# Patient Record
Sex: Female | Born: 1975 | Race: White | Hispanic: No | Smoking: Former smoker
Health system: Southern US, Community
[De-identification: ages and names within clinical notes are randomized; demographics above are authoritative.]

## PROBLEM LIST (undated history)

## (undated) HISTORY — PX: HIP SURGERY: SHX245

## (undated) HISTORY — PX: SHOULDER SURGERY: SHX246

## (undated) HISTORY — PX: KNEE SURGERY: SHX244

---

## 2015-01-13 ENCOUNTER — Emergency Department (HOSPITAL_BASED_OUTPATIENT_CLINIC_OR_DEPARTMENT_OTHER): Payer: BLUE CROSS/BLUE SHIELD

## 2015-01-13 ENCOUNTER — Emergency Department (HOSPITAL_BASED_OUTPATIENT_CLINIC_OR_DEPARTMENT_OTHER)
Admission: EM | Admit: 2015-01-13 | Discharge: 2015-01-13 | Disposition: A | Discharge status: Home / Self Care | Payer: BLUE CROSS/BLUE SHIELD | Attending: Emergency Medicine | Admitting: Emergency Medicine

## 2015-01-13 ENCOUNTER — Encounter (HOSPITAL_BASED_OUTPATIENT_CLINIC_OR_DEPARTMENT_OTHER): Payer: Self-pay | Admitting: Emergency Medicine

## 2015-01-13 DIAGNOSIS — R1011 Right upper quadrant pain: Secondary | ICD-10-CM | POA: Insufficient documentation

## 2015-01-13 DIAGNOSIS — Z3202 Encounter for pregnancy test, result negative: Secondary | ICD-10-CM | POA: Diagnosis not present

## 2015-01-13 DIAGNOSIS — Z87891 Personal history of nicotine dependence: Secondary | ICD-10-CM | POA: Insufficient documentation

## 2015-01-13 DIAGNOSIS — R1033 Periumbilical pain: Secondary | ICD-10-CM | POA: Diagnosis present

## 2015-01-13 DIAGNOSIS — R109 Unspecified abdominal pain: Secondary | ICD-10-CM

## 2015-01-13 LAB — CBC WITH DIFFERENTIAL/PLATELET
Basophils Absolute: 0.1 10*3/uL (ref 0.0–0.1)
Basophils Relative: 1 % (ref 0–1)
Eosinophils Absolute: 0.8 10*3/uL — ABNORMAL HIGH (ref 0.0–0.7)
Eosinophils Relative: 11 % — ABNORMAL HIGH (ref 0–5)
HCT: 36.1 % (ref 36.0–46.0)
Hemoglobin: 12.3 g/dL (ref 12.0–15.0)
LYMPHS ABS: 2.4 10*3/uL (ref 0.7–4.0)
Lymphocytes Relative: 33 % (ref 12–46)
MCH: 31.2 pg (ref 26.0–34.0)
MCHC: 34.1 g/dL (ref 30.0–36.0)
MCV: 91.6 fL (ref 78.0–100.0)
MONOS PCT: 7 % (ref 3–12)
Monocytes Absolute: 0.5 10*3/uL (ref 0.1–1.0)
Neutro Abs: 3.6 10*3/uL (ref 1.7–7.7)
Neutrophils Relative %: 48 % (ref 43–77)
Platelets: 277 10*3/uL (ref 150–400)
RBC: 3.94 MIL/uL (ref 3.87–5.11)
RDW: 12.4 % (ref 11.5–15.5)
WBC: 7.4 10*3/uL (ref 4.0–10.5)

## 2015-01-13 LAB — COMPREHENSIVE METABOLIC PANEL
ALT: 23 U/L (ref 14–54)
AST: 24 U/L (ref 15–41)
Albumin: 4.6 g/dL (ref 3.5–5.0)
Alkaline Phosphatase: 52 U/L (ref 38–126)
Anion gap: 8 (ref 5–15)
BUN: 12 mg/dL (ref 6–20)
CALCIUM: 8.9 mg/dL (ref 8.9–10.3)
CO2: 23 mmol/L (ref 22–32)
CREATININE: 0.91 mg/dL (ref 0.44–1.00)
Chloride: 106 mmol/L (ref 101–111)
GFR calc non Af Amer: >60 mL/min (ref >60)
GLUCOSE: 94 mg/dL (ref 65–99)
Potassium: 3.6 mmol/L (ref 3.5–5.1)
Sodium: 137 mmol/L (ref 135–145)
Total Bilirubin: 1 mg/dL (ref 0.3–1.2)
Total Protein: 7 g/dL (ref 6.5–8.1)

## 2015-01-13 LAB — URINALYSIS, ROUTINE W REFLEX MICROSCOPIC
Bilirubin Urine: NEGATIVE
Glucose, UA: NEGATIVE mg/dL
Hgb urine dipstick: NEGATIVE
Ketones, ur: NEGATIVE mg/dL
Leukocytes, UA: NEGATIVE
NITRITE: NEGATIVE
PROTEIN: NEGATIVE mg/dL
Specific Gravity, Urine: 1.009 (ref 1.005–1.030)
UROBILINOGEN UA: 0.2 mg/dL (ref 0.0–1.0)
pH: 5.5 (ref 5.0–8.0)

## 2015-01-13 LAB — PREGNANCY, URINE: Preg Test, Ur: NEGATIVE

## 2015-01-13 LAB — LIPASE, BLOOD: LIPASE: 20 U/L — AB (ref 22–51)

## 2015-01-13 MED ORDER — HYDROMORPHONE HCL 1 MG/ML IJ SOLN
1.0000 mg | Freq: Once | INTRAMUSCULAR | Status: AC
Start: 2015-01-13 — End: 2015-01-13
  Administered 2015-01-13: 1 mg via INTRAVENOUS
  Filled 2015-01-13: qty 1

## 2015-01-13 MED ORDER — HYDROMORPHONE HCL 1 MG/ML IJ SOLN
0.5000 mg | Freq: Once | INTRAMUSCULAR | Status: AC
Start: 2015-01-13 — End: 2015-01-13
  Administered 2015-01-13: 0.5 mg via INTRAVENOUS
  Filled 2015-01-13: qty 1

## 2015-01-13 NOTE — ED Provider Notes (Signed)
CSN: [id number]     Arrival date & time 01/13/15  1741 History  This chart was scribed for Margarita Grizzle, MD by Abel Presto, ED Scribe. This patient was seen in room MH01/MH01 and the patient's care was started at 6:06 PM.    Chief Complaint  Patient presents with  . Abdominal Pain      The history is provided by the patient. No language interpreter was used.   HPI Comments: Monette Twyman is a 39 y.o. female who presents to the Emergency Department complaining of 10/10 burning, stabbing periumbilical abdomianl pain with acute onset around 2 PM. Pt denies pain radiation. She took Aleve for relief. Pt's last normal BM was 6 AM. She states the last thing she ate was a protein shake. She denies chance of pregnancy. Pt is a former smoker and denies EtOH use. She denies changes in appetite, nausea, vomiting, diarrhea, fever, chills, urinary frequency, and dysuria.  History reviewed. No pertinent past medical history. Past Surgical History  Procedure Laterality Date  . Shoulder surgery    . Knee surgery     History reviewed. No pertinent family history. History  Substance Use Topics  . Smoking status: Former Games developer  . Smokeless tobacco: Not on file  . Alcohol Use: No   OB History    No data available     Review of Systems  Constitutional: Negative for fever, chills and appetite change.  Gastrointestinal: Positive for abdominal pain. Negative for nausea, vomiting and diarrhea.  Genitourinary: Negative for dysuria and frequency.  All other systems reviewed and are negative.     Allergies  Morphine and related  Home Medications   Prior to Admission medications   Not on File   BP 128/85 mmHg  Pulse 84  Temp(Src) 98.3 F (36.8 C) (Oral)  Resp 18  SpO2 100% Physical Exam  Constitutional: She is oriented to person, place, and time. She appears well-developed and well-nourished.  HENT:  Head: Normocephalic.  Eyes: Conjunctivae are normal.  Neck: Normal range of motion.  Neck supple.  Pulmonary/Chest: Effort normal.  Abdominal: Bowel sounds are decreased. There is tenderness (diffusely right side) in the right upper quadrant. There is guarding (voluntary). There is no rebound.  Musculoskeletal: Normal range of motion.  Neurological: She is alert and oriented to person, place, and time.  Skin: Skin is warm and dry.  Psychiatric: She has a normal mood and affect. Her behavior is normal.  Nursing note and vitals reviewed.   ED Course  Procedures (including critical care time) DIAGNOSTIC STUDIES: Oxygen Saturation is 100% on room air, normal by my interpretation.    COORDINATION OF CARE: 6:11 PM Discussed treatment plan with patient at beside, the patient agrees with the plan and has no further questions at this time.   Labs Review Labs Reviewed  CBC WITH DIFFERENTIAL/PLATELET - Abnormal; Notable for the following:    Eosinophils Relative 11 (*)    Eosinophils Absolute 0.8 (*)    All other components within normal limits  LIPASE, BLOOD - Abnormal; Notable for the following:    Lipase 20 (*)    All other components within normal limits  PREGNANCY, URINE  URINALYSIS, ROUTINE W REFLEX MICROSCOPIC (NOT AT [facility])  COMPREHENSIVE METABOLIC PANEL    Imaging Review Dg Abd 1 View  01/13/2015   CLINICAL DATA:  Sharp stabbing periumbilical abdominal pain, acute onset 6 hours ago.  EXAM: ABDOMEN - 1 VIEW  COMPARISON:  None.  FINDINGS: Bowel gas pattern is normal without evidence  of ileus, or obstruction. Upright study not ordered or performed. There is spinal curvature convex to the right with the apex at L3. IUD projects over the pelvis. No stones seen in the right upper quadrant or overlying either kidney. Small rounded calcifications in the pelvis typical of phleboliths. No definite ureteral stone. Previous ORIF for femur fracture on the right.  IMPRESSION: No acute or significant plain radiographic finding.  IUD in place.   Electronically Signed   By: Paulina FusiMark   Shogry M.D.   On: 01/13/2015 19:42   [place] Abdomen Complete  01/13/2015   CLINICAL DATA:  39 year old female with right lower quadrant abdominal pain.  EXAM: ULTRASOUND ABDOMEN COMPLETE  COMPARISON:  None.  FINDINGS: Gallbladder: No gallstones or wall thickening visualized. No sonographic Murphy sign noted.  Common bile duct: Diameter: 6 mm  Liver: There is a 7 x 8 x 9 mm hypoechoic/ anechoic area in the right lobe of the liver inferiorly. No interval flow identified within this lesion on color images. This most likely represents a cyst. MRI may provide better characterization.  IVC: No abnormality visualized.  Pancreas: There is a 1.8 x 0.5 cm hypoechoic 3 of within the pancreas (57/98). This is not well characterized and may related to an adjacent structure or bowel. A pancreatic or vascular lesion is not excluded. CT with contrast or MRI is recommended for better characterization.  Spleen: Size and appearance within normal limits.  Right Kidney: Length: 12 cm. Echogenicity within normal limits. No mass or hydronephrosis visualized.  Left Kidney: Length: 11 cm. Echogenicity within normal limits. No mass or hydronephrosis visualized.  Abdominal aorta: No aneurysm visualized.  Other findings: There is a ovoid structure with mixed/heterogeneous echotexture in the right lower quadrant in the region of the pain which may represent a non peristalsing loop of bowel. Doppler images demonstrate increased vascularity to this region. No free fluid identified.  IMPRESSION: Mildly dilated non peristalsing and hyperemic loop of bowel in the right lower quadrant.  Focal hypoechoic area in the body of the pancreas, incompletely characterized. CT or MRI is recommended for further characterization.  Probable right hepatic cyst.   Electronically Signed   By: Elgie CollardArash  Radparvar M.D.   On: 01/13/2015 19:35     EKG Interpretation None      MDM   Final diagnoses:  Abdominal pain   Patient feels better after IV pain  medication. Abdomen is soft and nontender. Plain abdominal x-rays reveal no evidence of acute abnormality. Ultrasound shows no evidence of acute cholecystitis. She does have a area in the pancreas that is hypoechoic but will require follow-up. She has a non-peristalsing hyperemic loop of bowel in the right lower quadrant. In light of symptoms resolving, no nausea, vomiting, fever, or leukocytosis, no specific therapy is indicated. Patient could have Spigelian hernia although no palpable deficit.   I've advised patient of return precautions and need for close follow-up and she voices understanding. I personally performed the services described in this documentation, which was scribed in my presence. The recorded information has been reviewed and considered.      Margarita Grizzleanielle Kabria Hetzer, MD 01/14/15 [phone]

## 2015-01-13 NOTE — ED Notes (Signed)
Denies any N/V or D

## 2015-01-13 NOTE — Discharge Instructions (Signed)
There is an area of the pancreas seen on ultrasound that should have follow-up by gastroenterologist. There is also an area of bowel that could represent a hernia.  Abdominal Pain, Women Abdominal (stomach, pelvic, or belly) pain can be caused by many things. It is important to tell your doctor:  The location of the pain.  Does it come and go or is it present all the time?  Are there things that start the pain (eating certain foods, exercise)?  Are there other symptoms associated with the pain (fever, nausea, vomiting, diarrhea)? All of this is helpful to know when trying to find the cause of the pain. CAUSES   Stomach: virus or bacteria infection, or ulcer.  Intestine: appendicitis (inflamed appendix), regional ileitis (Crohn's disease), ulcerative colitis (inflamed colon), irritable bowel syndrome, diverticulitis (inflamed diverticulum of the colon), or cancer of the stomach or intestine.  Gallbladder disease or stones in the gallbladder.  Kidney disease, kidney stones, or infection.  Pancreas infection or cancer.  Fibromyalgia (pain disorder).  Diseases of the female organs:  Uterus: fibroid (non-cancerous) tumors or infection.  Fallopian tubes: infection or tubal pregnancy.  Ovary: cysts or tumors.  Pelvic adhesions (scar tissue).  Endometriosis (uterus lining tissue growing in the pelvis and on the pelvic organs).  Pelvic congestion syndrome (female organs filling up with blood just before the menstrual period).  Pain with the menstrual period.  Pain with ovulation (producing an egg).  Pain with an IUD (intrauterine device, birth control) in the uterus.  Cancer of the female organs.  Functional pain (pain not caused by a disease, may improve without treatment).  Psychological pain.  Depression. DIAGNOSIS  Your doctor will decide the seriousness of your pain by doing an examination.  Blood tests.  X-rays.  Ultrasound.  CT scan (computed tomography,  special type of X-[place]).  MRI (magnetic resonance imaging).  Cultures, for infection.  Barium enema (dye inserted in the large intestine, to better view it with X-rays).  Colonoscopy (looking in intestine with a lighted tube).  Laparoscopy (minor surgery, looking in abdomen with a lighted tube).  Major abdominal exploratory surgery (looking in abdomen with a large incision). TREATMENT  The treatment will depend on the cause of the pain.   Many cases can be observed and treated at home.  Over-the-counter medicines recommended by your caregiver.  Prescription medicine.  Antibiotics, for infection.  Birth control pills, for painful periods or for ovulation pain.  Hormone treatment, for endometriosis.  Nerve blocking injections.  Physical therapy.  Antidepressants.  Counseling with a psychologist or psychiatrist.  Minor or major surgery. HOME CARE INSTRUCTIONS   Do not take laxatives, unless directed by your caregiver.  Take over-the-counter pain medicine only if ordered by your caregiver. Do not take aspirin because it can cause an upset stomach or bleeding.  Try a clear liquid diet (broth or water) as ordered by your caregiver. Slowly move to a bland diet, as tolerated, if the pain is related to the stomach or intestine.  Have a thermometer and take your temperature several times a day, and record it.  Bed rest and sleep, if it helps the pain.  Avoid sexual intercourse, if it causes pain.  Avoid stressful situations.  Keep your follow-up appointments and tests, as your caregiver orders.  If the pain does not go away with medicine or surgery, you may try:  Acupuncture.  Relaxation exercises (yoga, meditation).  Group therapy.  Counseling. SEEK MEDICAL CARE IF:   You notice certain foods cause  stomach pain.  Your home care treatment is not helping your pain.  You need stronger pain medicine.  You want your IUD removed.  You feel faint or  lightheaded.  You develop nausea and vomiting.  You develop a rash.  You are having side effects or an allergy to your medicine. SEEK IMMEDIATE MEDICAL CARE IF:   Your pain does not go away or gets worse.  You have a fever.  Your pain is felt only in portions of the abdomen. The right side could possibly be appendicitis. The left lower portion of the abdomen could be colitis or diverticulitis.  You are passing blood in your stools (bright red or black tarry stools, with or without vomiting).  You have blood in your urine.  You develop chills, with or without a fever.  You pass out. MAKE SURE YOU:   Understand these instructions.  Will watch your condition.  Will get help right away if you are not doing well or get worse. Document Released: 04/19/2007 Document Revised: 11/06/2013 Document Reviewed: 05/09/2009 [facility] Patient Information 2015 [place], [place]. This information is not intended to replace advice given to you by your health care provider. Make sure you discuss any questions you have with your health care provider.

## 2015-01-13 NOTE — ED Notes (Signed)
Patient transported to X-ray via stretcher, sr x 2 up 

## 2015-01-13 NOTE — ED Notes (Signed)
Pt placed on cont pox monitoring

## 2015-01-13 NOTE — ED Notes (Signed)
Here for abd pain with onset at approx at 2pm

## 2015-01-13 NOTE — ED Notes (Signed)
MD at bedside. 

## 2015-01-13 NOTE — ED Notes (Signed)
Pt in c/o sudden onset periumbilical pain starting approx 4 hours ago. Pt is tearful and holding abdomin in triage. Denies vaginal discharge or bleeding.

## 2015-01-13 NOTE — ED Notes (Signed)
Pt noted to have increase in pain, facial grimmacing, moaning, crying and drawing up knees, guarding all noted during EDP exam

## 2015-01-18 ENCOUNTER — Other Ambulatory Visit: Payer: Self-pay | Admitting: Gastroenterology

## 2015-01-18 DIAGNOSIS — K869 Disease of pancreas, unspecified: Secondary | ICD-10-CM

## 2015-01-23 ENCOUNTER — Other Ambulatory Visit: Payer: BLUE CROSS/BLUE SHIELD

## 2015-01-28 ENCOUNTER — Inpatient Hospital Stay: Admission: RE | Admit: 2015-01-28 | Payer: BLUE CROSS/BLUE SHIELD | Source: Ambulatory Visit

## 2015-02-05 ENCOUNTER — Ambulatory Visit
Admission: RE | Admit: 2015-02-05 | Discharge: 2015-02-05 | Disposition: A | Discharge status: Home / Self Care | Payer: BLUE CROSS/BLUE SHIELD | Source: Ambulatory Visit | Attending: Gastroenterology | Admitting: Gastroenterology

## 2015-02-05 DIAGNOSIS — K869 Disease of pancreas, unspecified: Secondary | ICD-10-CM

## 2015-02-05 MED ORDER — IOPAMIDOL (ISOVUE-300) INJECTION 61%
100.0000 mL | Freq: Once | INTRAVENOUS | Status: AC | PRN
  Administered 2015-02-05: 100 mL via INTRAVENOUS

## 2015-08-08 ENCOUNTER — Other Ambulatory Visit: Payer: Self-pay | Admitting: Gastroenterology

## 2015-08-08 DIAGNOSIS — K7689 Other specified diseases of liver: Secondary | ICD-10-CM

## 2015-08-30 ENCOUNTER — Inpatient Hospital Stay: Admission: RE | Admit: 2015-08-30 | Payer: BLUE CROSS/BLUE SHIELD | Source: Ambulatory Visit

## 2015-09-19 ENCOUNTER — Other Ambulatory Visit: Payer: Self-pay | Admitting: Gastroenterology

## 2015-09-19 DIAGNOSIS — K7689 Other specified diseases of liver: Secondary | ICD-10-CM

## 2015-10-07 ENCOUNTER — Ambulatory Visit
Admission: RE | Admit: 2015-10-07 | Discharge: 2015-10-07 | Disposition: A | Discharge status: Home / Self Care | Payer: BLUE CROSS/BLUE SHIELD | Source: Ambulatory Visit | Attending: Gastroenterology | Admitting: Gastroenterology

## 2015-10-07 DIAGNOSIS — K7689 Other specified diseases of liver: Secondary | ICD-10-CM

## 2015-10-07 MED ORDER — GADOXETATE DISODIUM 0.25 MMOL/ML IV SOLN
6.0000 mL | Freq: Once | INTRAVENOUS | Status: AC | PRN
  Administered 2015-10-07: 6 mL via INTRAVENOUS

## 2015-10-21 DIAGNOSIS — G2581 Restless legs syndrome: Secondary | ICD-10-CM | POA: Insufficient documentation

## 2015-10-21 DIAGNOSIS — G43909 Migraine, unspecified, not intractable, without status migrainosus: Secondary | ICD-10-CM | POA: Insufficient documentation

## 2015-10-21 DIAGNOSIS — J309 Allergic rhinitis, unspecified: Secondary | ICD-10-CM | POA: Insufficient documentation

## 2016-10-20 IMAGING — US US ABDOMEN COMPLETE
1 series · 13 of 25 positions shown · non-contrast
Comparison: None.

CLINICAL DATA: 39-year-old female with right lower quadrant
abdominal pain.

EXAM:
ULTRASOUND ABDOMEN COMPLETE

[Series 1: us abdomen complete · 0.17mm/px · 13 of 97 slices shown]
[im 1/97]
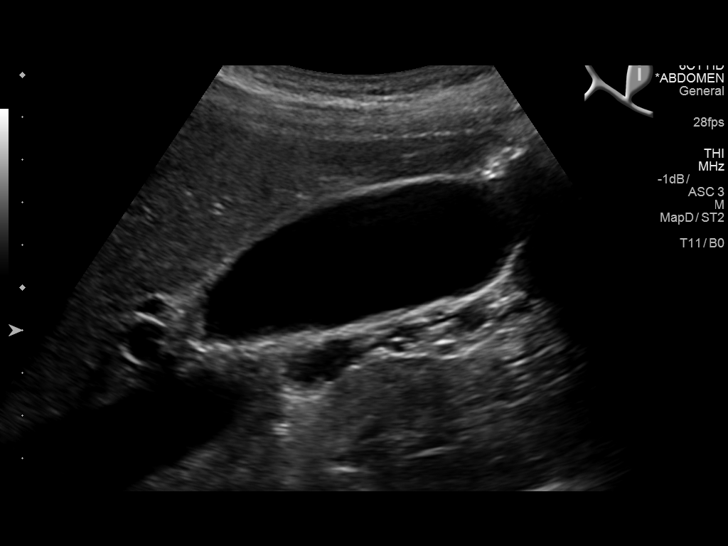
[im 9/97]
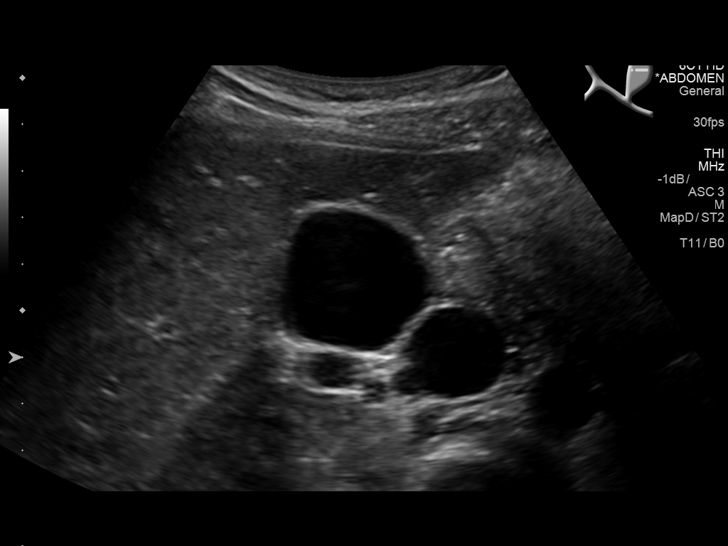
[im 17/97]
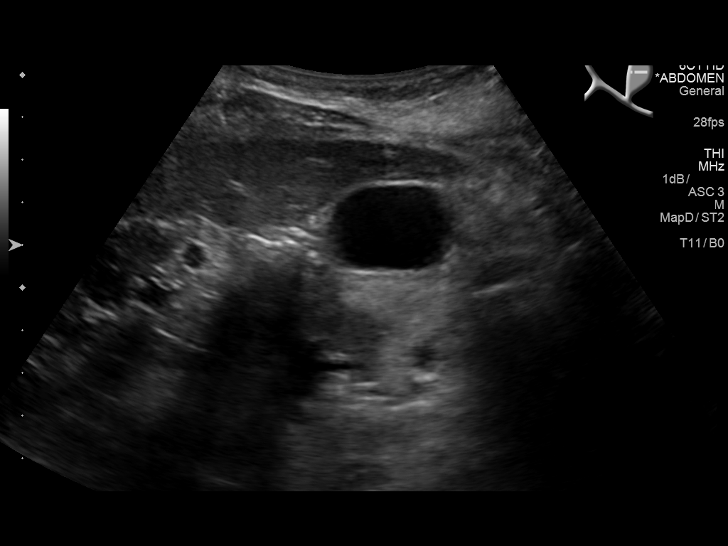
[im 25/97]
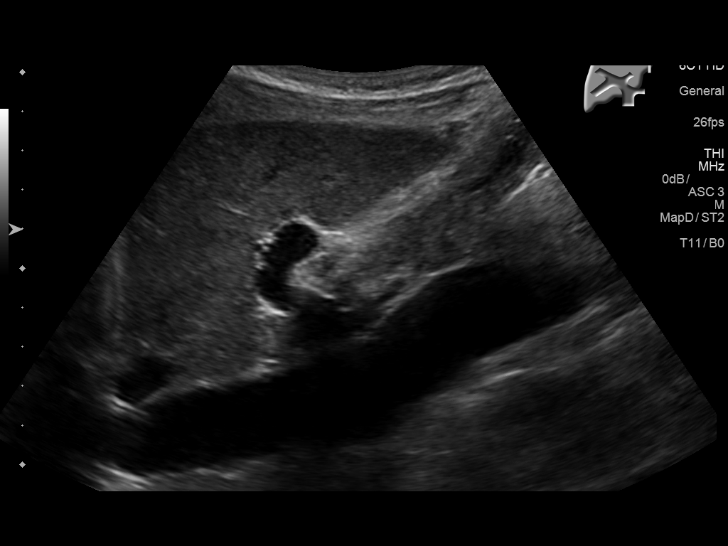
[im 33/97]
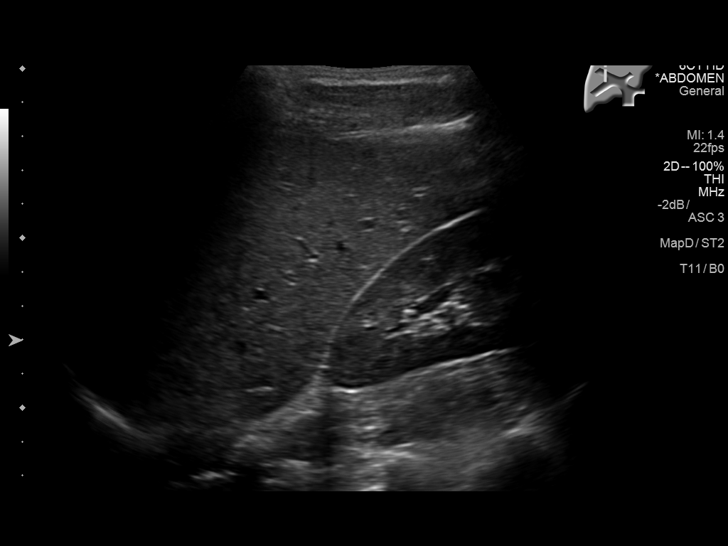
[im 41/97]
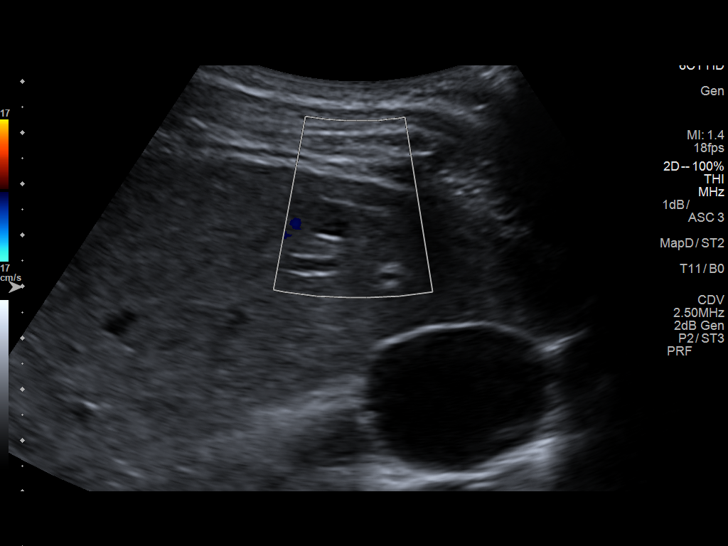
[im 49/97]
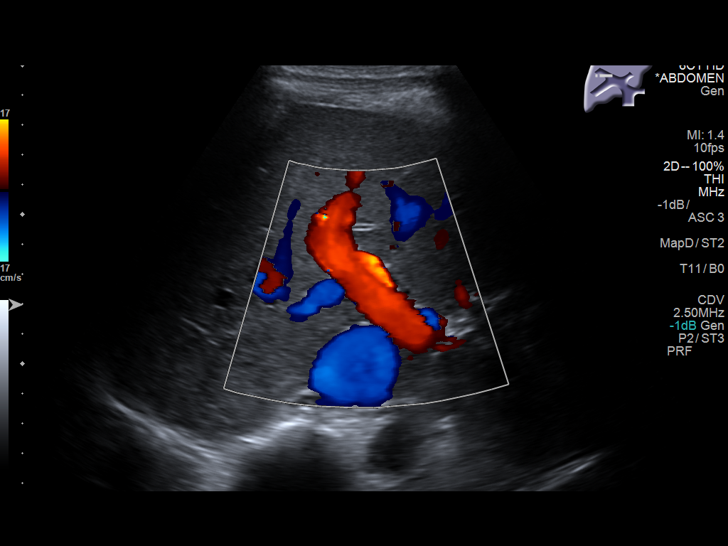
[im 57/97]
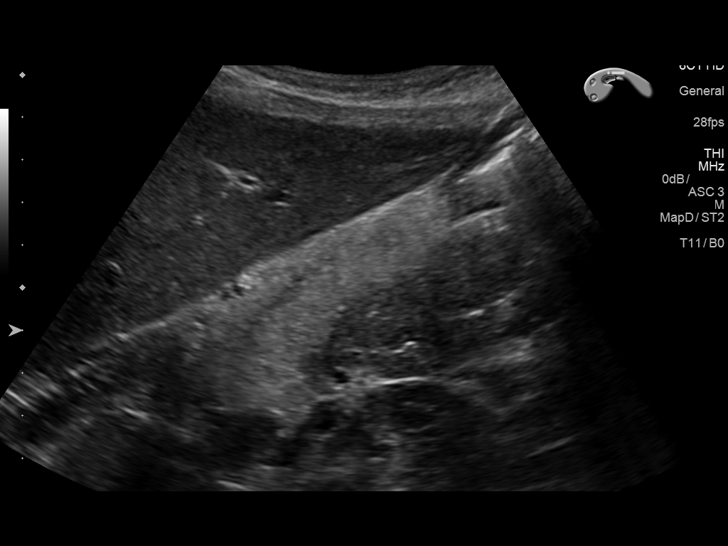
[im 65/97]
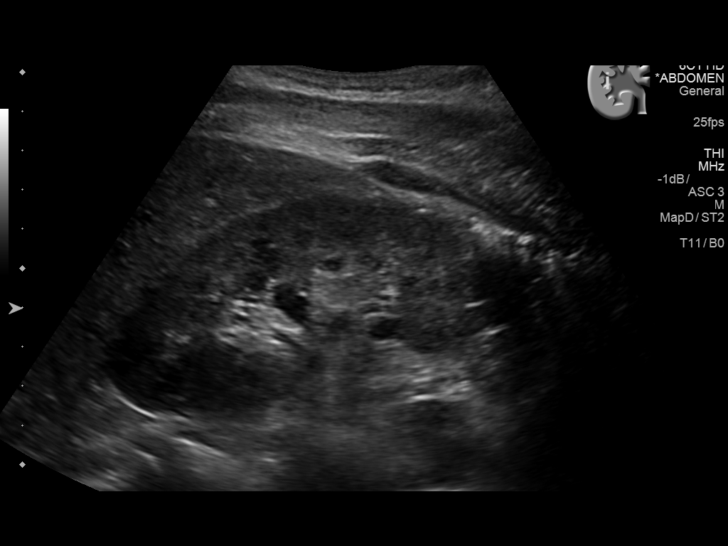
[im 73/97]
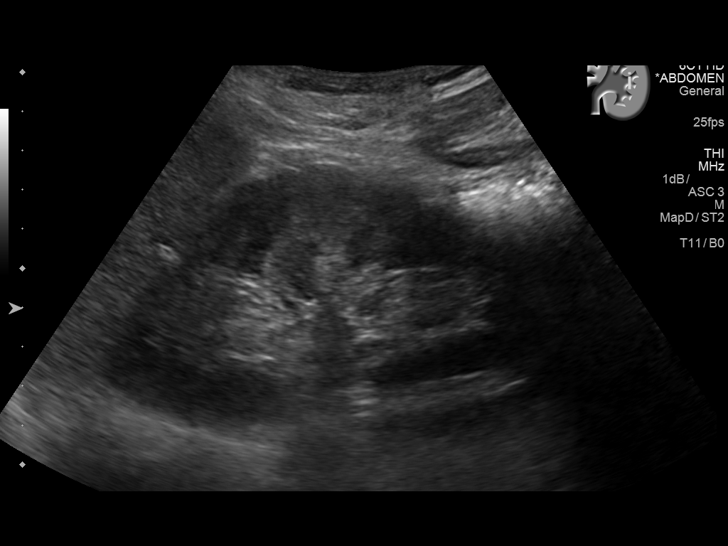
[im 81/97]
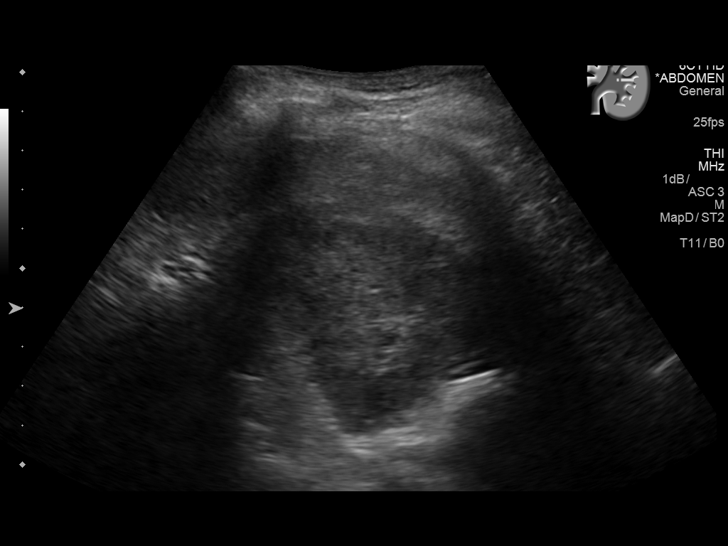
[im 89/97]
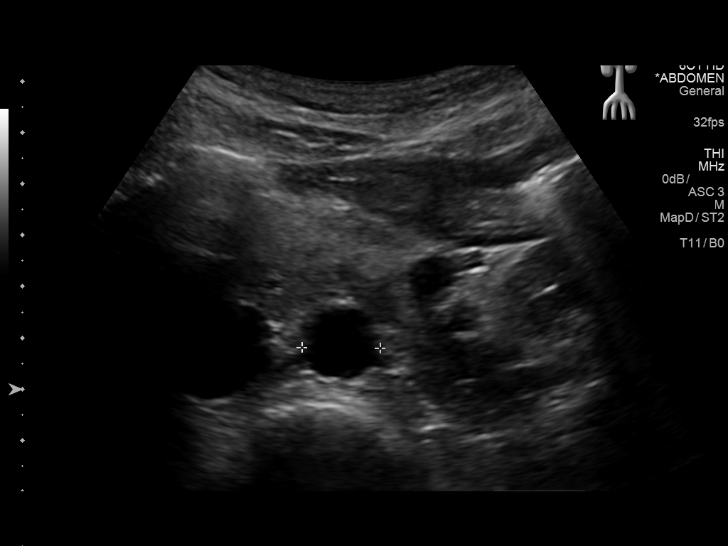
[im 97/97]
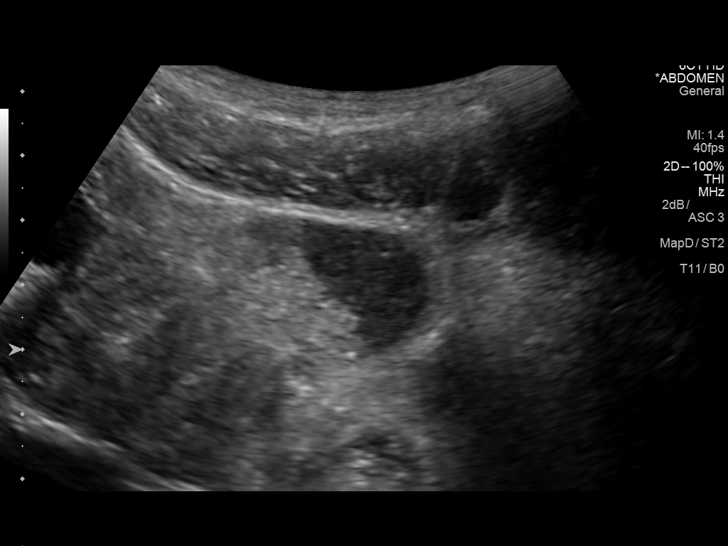

[13 of 25 positions shown; findings below may reference images not displayed]

FINDINGS: Gallbladder: No gallstones or wall thickening visualized. No
sonographic Murphy sign noted.

Common bile duct: Diameter: 6 mm

Liver: There is a 7 x 8 x 9 mm hypoechoic/ anechoic area in the
right lobe of the liver inferiorly. No interval flow identified
within this lesion on color images. This most likely represents a
cyst. MRI may provide better characterization.

IVC: No abnormality visualized.

Pancreas: There is a 1.8 x 0.5 cm hypoechoic 3 of within the
pancreas (57/98). This is not well characterized and may related to
an adjacent structure or bowel. A pancreatic or vascular lesion is
not excluded. CT with contrast or MRI is recommended for better
characterization.

Spleen: Size and appearance within normal limits.

Right Kidney: Length: 12 cm. Echogenicity within normal limits. No
mass or hydronephrosis visualized.

Left Kidney: Length: 11 cm. Echogenicity within normal limits. No
mass or hydronephrosis visualized.

Abdominal aorta: No aneurysm visualized.

Other findings: There is a ovoid structure with mixed/heterogeneous
echotexture in the right lower quadrant in the region of the pain
which may represent a non peristalsing loop of bowel. Doppler images
demonstrate increased vascularity to this region. No free fluid
identified.
IMPRESSION: Mildly dilated non peristalsing and hyperemic loop of bowel in the
right lower quadrant.

Focal hypoechoic area in the body of the pancreas, incompletely
characterized. CT or MRI is recommended for further
characterization.

Probable right hepatic cyst.

## 2019-02-28 DIAGNOSIS — R519 Headache, unspecified: Secondary | ICD-10-CM | POA: Insufficient documentation

## 2022-09-05 ENCOUNTER — Emergency Department (HOSPITAL_BASED_OUTPATIENT_CLINIC_OR_DEPARTMENT_OTHER): Payer: BC Managed Care – PPO

## 2022-09-05 ENCOUNTER — Encounter (HOSPITAL_BASED_OUTPATIENT_CLINIC_OR_DEPARTMENT_OTHER): Payer: Self-pay

## 2022-09-05 ENCOUNTER — Other Ambulatory Visit: Payer: Self-pay

## 2022-09-05 ENCOUNTER — Inpatient Hospital Stay (HOSPITAL_BASED_OUTPATIENT_CLINIC_OR_DEPARTMENT_OTHER)
Admission: EM | Admit: 2022-09-05 | Discharge: 2022-09-08 | DRG: 417 | Disposition: A | Discharge status: Home / Self Care | Payer: BC Managed Care – PPO | Attending: Student | Admitting: Student

## 2022-09-05 DIAGNOSIS — J320 Chronic maxillary sinusitis: Secondary | ICD-10-CM | POA: Diagnosis present

## 2022-09-05 DIAGNOSIS — K8000 Calculus of gallbladder with acute cholecystitis without obstruction: Principal | ICD-10-CM | POA: Diagnosis present

## 2022-09-05 DIAGNOSIS — F32A Depression, unspecified: Secondary | ICD-10-CM | POA: Diagnosis present

## 2022-09-05 DIAGNOSIS — Z888 Allergy status to other drugs, medicaments and biological substances status: Secondary | ICD-10-CM

## 2022-09-05 DIAGNOSIS — K851 Biliary acute pancreatitis without necrosis or infection: Secondary | ICD-10-CM

## 2022-09-05 DIAGNOSIS — G2581 Restless legs syndrome: Secondary | ICD-10-CM | POA: Diagnosis present

## 2022-09-05 DIAGNOSIS — K810 Acute cholecystitis: Secondary | ICD-10-CM

## 2022-09-05 DIAGNOSIS — K819 Cholecystitis, unspecified: Secondary | ICD-10-CM

## 2022-09-05 DIAGNOSIS — Z87891 Personal history of nicotine dependence: Secondary | ICD-10-CM

## 2022-09-05 DIAGNOSIS — J4520 Mild intermittent asthma, uncomplicated: Secondary | ICD-10-CM

## 2022-09-05 DIAGNOSIS — R7401 Elevation of levels of liver transaminase levels: Secondary | ICD-10-CM | POA: Diagnosis present

## 2022-09-05 DIAGNOSIS — G43909 Migraine, unspecified, not intractable, without status migrainosus: Secondary | ICD-10-CM | POA: Diagnosis present

## 2022-09-05 DIAGNOSIS — Z885 Allergy status to narcotic agent status: Secondary | ICD-10-CM

## 2022-09-05 DIAGNOSIS — D649 Anemia, unspecified: Secondary | ICD-10-CM | POA: Insufficient documentation

## 2022-09-05 DIAGNOSIS — K7689 Other specified diseases of liver: Secondary | ICD-10-CM | POA: Diagnosis present

## 2022-09-05 LAB — COMPREHENSIVE METABOLIC PANEL
ALT: 374 U/L — ABNORMAL HIGH (ref 0–44)
AST: 424 U/L — ABNORMAL HIGH (ref 15–41)
Albumin: 4.2 g/dL (ref 3.5–5.0)
Alkaline Phosphatase: 188 U/L — ABNORMAL HIGH (ref 38–126)
Anion gap: 8 (ref 5–15)
BUN: 14 mg/dL (ref 6–20)
CO2: 23 mmol/L (ref 22–32)
Calcium: 8.9 mg/dL (ref 8.9–10.3)
Chloride: 107 mmol/L (ref 98–111)
Creatinine, Ser: 0.92 mg/dL (ref 0.44–1.00)
GFR, Estimated: >60 mL/min (ref >60)
Glucose, Bld: 106 mg/dL — ABNORMAL HIGH (ref 70–99)
Potassium: 3.5 mmol/L (ref 3.5–5.1)
Sodium: 138 mmol/L (ref 135–145)
Total Bilirubin: 1.3 mg/dL — ABNORMAL HIGH (ref 0.3–1.2)
Total Protein: 7.3 g/dL (ref 6.5–8.1)

## 2022-09-05 LAB — LIPASE, BLOOD: Lipase: 4916 U/L — ABNORMAL HIGH (ref 11–51)

## 2022-09-05 LAB — URINALYSIS, ROUTINE W REFLEX MICROSCOPIC
Bilirubin Urine: NEGATIVE
Glucose, UA: NEGATIVE mg/dL
Hgb urine dipstick: NEGATIVE
Ketones, ur: NEGATIVE mg/dL
Leukocytes,Ua: NEGATIVE
Nitrite: NEGATIVE
Protein, ur: NEGATIVE mg/dL
Specific Gravity, Urine: 1.015 (ref 1.005–1.030)
pH: 8.5 — ABNORMAL HIGH (ref 5.0–8.0)

## 2022-09-05 LAB — TROPONIN I (HIGH SENSITIVITY)
Troponin I (High Sensitivity): 2 ng/L (ref <18)
Troponin I (High Sensitivity): <2 ng/L (ref <18)

## 2022-09-05 LAB — CBC
HCT: 39.3 % (ref 36.0–46.0)
Hemoglobin: 13.6 g/dL (ref 12.0–15.0)
MCH: 31.7 pg (ref 26.0–34.0)
MCHC: 34.6 g/dL (ref 30.0–36.0)
MCV: 91.6 fL (ref 80.0–100.0)
Platelets: 305 10*3/uL (ref 150–400)
RBC: 4.29 MIL/uL (ref 3.87–5.11)
RDW: 12.4 % (ref 11.5–15.5)
WBC: 8.4 10*3/uL (ref 4.0–10.5)
nRBC: 0 % (ref 0.0–0.2)

## 2022-09-05 LAB — PREGNANCY, URINE: Preg Test, Ur: NEGATIVE

## 2022-09-05 MED ORDER — METRONIDAZOLE 500 MG/100ML IV SOLN
500.0000 mg | Freq: Once | INTRAVENOUS | Status: AC
  Administered 2022-09-05: 500 mg via INTRAVENOUS
  Filled 2022-09-05: qty 100

## 2022-09-05 MED ORDER — HYDROMORPHONE HCL 1 MG/ML IJ SOLN
0.5000 mg | Freq: Once | INTRAMUSCULAR | Status: AC
  Administered 2022-09-05: 0.5 mg via INTRAVENOUS
  Filled 2022-09-05: qty 1

## 2022-09-05 MED ORDER — SODIUM CHLORIDE 0.9 % IV SOLN: Freq: Once | INTRAVENOUS | Status: AC

## 2022-09-05 MED ORDER — LACTATED RINGERS IV BOLUS
1000.0000 mL | Freq: Once | INTRAVENOUS | Status: AC
  Administered 2022-09-05: 1000 mL via INTRAVENOUS

## 2022-09-05 MED ORDER — SODIUM CHLORIDE 0.9 % IV SOLN
2.0000 g | Freq: Once | INTRAVENOUS | Status: AC
  Administered 2022-09-05: 2 g via INTRAVENOUS
  Filled 2022-09-05: qty 20

## 2022-09-05 MED ORDER — ONDANSETRON HCL 4 MG/2ML IJ SOLN
4.0000 mg | Freq: Once | INTRAMUSCULAR | Status: AC
  Administered 2022-09-05: 4 mg via INTRAVENOUS
  Filled 2022-09-05: qty 2

## 2022-09-05 NOTE — ED Provider Notes (Signed)
[place] [facility] Provider Note   CSN: [id number] Arrival date & time: 09/05/22  1443     History Chief Complaint  Patient presents with   Abdominal Pain    Christine Montoya is a 47 y.o. female with h/o restless leg syndrome and migraines presents to the ER for evaluation of her epigastric/RUQ pain and nausea since this AM. The patient denies any trauma to the area.  She denies any vomiting, diarrhea, constipation, melena, hematochezia, dysuria, hematuria.  She denies any radiation of the pain.  She can add all this morning as well as some Gas-X she felt like she was just about gas and needed to have a bowel movement.  She does use vape tobacco.  Denies any EtOH or illicit drug use.  Allergic to morphine.   Abdominal Pain Associated symptoms: nausea   Associated symptoms: no chest pain, no chills, no constipation, no diarrhea, no dysuria, no fever, no hematuria, no shortness of breath, no vaginal bleeding, no vaginal discharge and no vomiting        Home Medications Prior to Admission medications   Not on File      Allergies    Morphine and related    Review of Systems   Review of Systems  Constitutional:  Negative for chills and fever.  Respiratory:  Negative for shortness of breath.   Cardiovascular:  Negative for chest pain.  Gastrointestinal:  Positive for abdominal pain and nausea. Negative for blood in stool, constipation, diarrhea and vomiting.  Genitourinary:  Negative for dysuria, hematuria, vaginal bleeding and vaginal discharge.    Physical Exam Updated Vital Signs BP 124/89   Pulse 75   Temp 98.4 F (36.9 C) (Oral)   Resp 17   Ht 5\' 5"  (1.651 m)   Wt 68 kg   SpO2 98%   BMI 24.96 kg/m  Physical Exam Vitals and nursing note reviewed.  Constitutional:      Appearance: She is not toxic-appearing.     Comments: Uncomfortable, but not toxic appearing  HENT:     Mouth/Throat:     Mouth: Mucous membranes are moist.   Cardiovascular:     Rate and Rhythm: Normal rate and regular rhythm.  Pulmonary:     Effort: Pulmonary effort is normal. No respiratory distress.     Breath sounds: Normal breath sounds.  Abdominal:     General: Bowel sounds are normal. There is no distension.     Palpations: Abdomen is soft.     Tenderness: There is abdominal tenderness in the right upper quadrant and epigastric area. There is no right CVA tenderness, left CVA tenderness, guarding or rebound.  Skin:    General: Skin is warm and dry.  Neurological:     General: No focal deficit present.     Mental Status: She is alert.     ED Results / Procedures / Treatments   Labs (all labs ordered are listed, but only abnormal results are displayed) Labs Reviewed  LIPASE, BLOOD - Abnormal; Notable for the following components:      Result Value   Lipase 4,916 (*)    All other components within normal limits  COMPREHENSIVE METABOLIC PANEL - Abnormal; Notable for the following components:   Glucose, Bld 106 (*)    AST 424 (*)    ALT 374 (*)    Alkaline Phosphatase 188 (*)    Total Bilirubin 1.3 (*)    All other components within normal limits  URINALYSIS, ROUTINE  W REFLEX MICROSCOPIC - Abnormal; Notable for the following components:   pH 8.5 (*)    All other components within normal limits  CBC  PREGNANCY, URINE  TROPONIN I (HIGH SENSITIVITY)  TROPONIN I (HIGH SENSITIVITY)    EKG None  Radiology US Abdomen Limited RUQ (LIVER/GB)  Result Date: 09/05/2022 CLINICAL DATA:  Epigastric pain EXAM: ULTRASOUND ABDOMEN LIMITED RIGHT UPPER QUADRANT COMPARISON:  Ultrasound July 2016.  CT August 2016.  MRI [place] 2017 FINDINGS: Gallbladder: Shadowing stones in the gallbladder. Gallbladder is distended. There is however wall thickening and adjacent edema. Common bile duct: Diameter: 4 mm Liver: No focal lesion identified. Within normal limits in parenchymal echogenicity. Known benign-appearing hepatic cysts measuring 17 mm. Portal  vein is patent on color Doppler imaging with normal direction of blood flow towards the liver. Other: None. IMPRESSION: Gallbladder wall thickening and pericholecystic fluid with multiple stones. Please correlate for other clinical findings of acute cholecystitis. No ductal dilatation. Stable hepatic cyst Electronically Signed   By: Karen Kays M.D.   On: 09/05/2022 15:51   DG Chest 2 View  Result Date: 09/05/2022 CLINICAL DATA:  Pain EXAM: CHEST - 2 VIEW COMPARISON:  None Available. FINDINGS: The heart size and mediastinal contours are within normal limits. Both lungs are clear. The visualized skeletal structures are unremarkable. IMPRESSION: No active cardiopulmonary disease. Electronically Signed   By: Gerome Sam III M.D.   On: 09/05/2022 15:28    Procedures Procedures   Medications Ordered in ED Medications  metroNIDAZOLE (FLAGYL) IVPB 500 mg (500 mg Intravenous New Bag/Given 09/05/22 2325)  lactated ringers bolus 1,000 mL (0 mLs Intravenous Stopped 09/05/22 2156)  HYDROmorphone (DILAUDID) injection 0.5 mg (0.5 mg Intravenous Given 09/05/22 2055)  ondansetron (ZOFRAN) injection 4 mg (4 mg Intravenous Given 09/05/22 2054)  cefTRIAXone (ROCEPHIN) 2 g in sodium chloride 0.9 % 100 mL IVPB (0 g Intravenous Stopped 09/05/22 2323)  0.9 %  sodium chloride infusion ( Intravenous New Bag/Given 09/05/22 2241)    ED Course/ Medical Decision Making/ A&P Clinical Course as of 09/05/22 2343  Sat Sep 05, 2022  2053 US Abdomen Limited RUQ (LIVER/GB) [RR]    Clinical Course User Index [RR] Achille Rich, PA-C   Medical Decision Making Amount and/or Complexity of Data Reviewed Labs: ordered. Radiology: ordered. Decision-making details documented in ED Course.  Risk Prescription drug management. Decision regarding hospitalization.   47 y.o. female presents to the ER for evaluation of epigastric/right upper quadrant pain with nausea starting today. Differential diagnosis includes but is not limited to  AAA, mesenteric ischemia, appendicitis, diverticulitis, DKA, gastroenteritis, nephrolithiasis, pancreatitis, constipation, UTI, bowel obstruction, biliary disease, IBD, PUD, hepatitis. Vital signs unremarkable. Physical exam as noted above.   Labs and imaging ordered in triage.   I independently reviewed and interpreted the patient's labs.  CBC shows no cytosis or anemia.  CMP does show mildly elevated glucose.  She has elevated liver enzymes.  AST is 424, ALT 374, alk phos 188 with a total bilirubin of 1.3.  Urinalysis does show increased pH of urine otherwise no signs of infection.  Pregnancy test is negative.  Troponin less than 2.  Lipase is significantly elevated at 4916.  Likely gallstone induced pancreatitis.  Ultrasound imaging shows Gallbladder wall thickening and pericholecystic fluid with multiple stones. Please correlate for other clinical findings of acute cholecystitis. No ductal dilatation. Stable hepatic cyst. CBD at 4mm.  Given these findings, I CONSULTED GI and surgery. I spoke with surgery, Dr. Corliss Skains, he recommends waiting for the  patient's pancreas to settle down some before patient may need her gallbladder removed.  I then spoke to Dr. Elnoria Howard who likely that the patient needed to have her gallbladder removed however given inflammation of her pancreas does not need to be done tonight.  Did give her some Rocephin as well as some Flagyl for intra-abdominal coverage given her cholecystitis.  Patient again has unremarkable vital signs and is afebrile.  No white blood cell count seen as well.  Her pain is well-controlled with the Dilaudid and fluids.  Zofran ordered as well.    I discussed with the patient at bedside her lab and imaging results.  We discussed the need to be admitted for likely surgery.  Patient will remain n.p.o. for pancreatitis.  Patient confirms her understanding of remaining n.p.o. for this.  She will be admitted.  Discussed this case with Dr. Raphael Gibney.  Portions of  this report may have been transcribed using voice recognition software. Every effort was made to ensure accuracy; however, inadvertent computerized transcription errors may be present.   Final Clinical Impression(s) / ED Diagnoses Final diagnoses:  Acute gallstone pancreatitis  Cholecystitis    Rx / DC Orders ED Discharge Orders     None         Achille Rich, PA-C 09/05/22 2343    Derwood Kaplan, MD 09/06/22 1626

## 2022-09-05 NOTE — ED Notes (Signed)
[facility] called for transport. 

## 2022-09-05 NOTE — ED Triage Notes (Signed)
C/o epigastric pain since 0800 this morning with some nausea. Pain worsens with taking deep breath. Tenderness noted

## 2022-09-05 NOTE — ED Notes (Signed)
[facility] eta 10 - 15 minutes

## 2022-09-05 NOTE — ED Provider Triage Note (Signed)
Emergency Medicine Provider Triage Evaluation Note  Christine Montoya , a 47 y.o. female  was evaluated in triage.  Pt complains of epigastric/RUQ pain since 0800 this AM. Reports some nausea. Hurts worse with deep breath in. No vomiting, change in bowels, fevers, or urinary symptoms. Denies any radaition of pain.  Review of Systems  Positive:  Negative:   Physical Exam  BP (!) 131/97 (BP Location: Right Arm)   Pulse 85   Temp 98.4 F (36.9 C) (Oral)   Resp 18   Ht '5\' 5"'$  (1.651 m)   Wt 68 kg   SpO2 96%   BMI 24.96 kg/m  Gen:   Awake, no distress   Resp:  Normal effort  MSK:   Moves extremities without difficulty  Other:  Epigastric tenderness to palpation. Soft.   Medical Decision Making  Medically screening exam initiated at 3:17 PM.  Appropriate orders placed.  Christine Montoya was informed that the remainder of the evaluation will be completed by another provider, this initial triage assessment does not replace that evaluation, and the importance of remaining in the ED until their evaluation is complete.  Labs and imaging ordered.    Sherrell Puller, [place] 09/05/22 [id number]

## 2022-09-05 NOTE — Progress Notes (Signed)
Plan of Care Note for accepted transfer   Patient: Christine Montoya MRN: [id number]   DOA: 09/05/2022  Facility requesting transfer: [facility] ED Requesting Provider: Ovid Curd, PA-EDP Reason for transfer: Acute cholecystitis, gallstone pancreatitis.    Facility course: The patient is a 47 year old female with past medical history significant for restless leg syndrome and migraine, who presents to [facility] ED with complaints of epigastric pain and right upper quadrant abdominal pain that started the morning of her presentation.  Associated with nausea and no vomiting.  No reported subjective fevers or chills.  In the ED, workup revealed elevated liver chemistries with T. bili of 1.3.  Lipase nearly 5000.  CT abdomen pelvis with and without contrast revealed no acute or suspicious findings.  Right upper quadrant abdominal ultrasound revealed gallbladder wall thickening and pericholecystic fluid with multiple stones.  No ductal dilatation.  EDP discussed the case with GI, Dr. Benson Norway, IV antibiotics were advised.  The patient was started on Rocephin and IV Flagyl.  Also received 1 L IV fluid bolus LR x 1.  The patient was admitted at [facility] telemetry surgical unit as inpatient status.  Plan of care: The patient is accepted for admission to Telemetry unit, at [facility].  Inpatient status.  Author: Kayleen Memos, DO 09/05/2022  Check [email or web address] for on-call coverage.  Nursing staff, Please call [place] number on Amion as soon as patient's arrival, so appropriate admitting provider can evaluate the pt.

## 2022-09-06 ENCOUNTER — Inpatient Hospital Stay (HOSPITAL_COMMUNITY): Payer: BC Managed Care – PPO

## 2022-09-06 DIAGNOSIS — K810 Acute cholecystitis: Secondary | ICD-10-CM

## 2022-09-06 DIAGNOSIS — G2581 Restless legs syndrome: Secondary | ICD-10-CM | POA: Diagnosis present

## 2022-09-06 DIAGNOSIS — G43909 Migraine, unspecified, not intractable, without status migrainosus: Secondary | ICD-10-CM | POA: Diagnosis present

## 2022-09-06 DIAGNOSIS — E876 Hypokalemia: Secondary | ICD-10-CM | POA: Diagnosis not present

## 2022-09-06 DIAGNOSIS — D649 Anemia, unspecified: Secondary | ICD-10-CM | POA: Diagnosis present

## 2022-09-06 DIAGNOSIS — Z888 Allergy status to other drugs, medicaments and biological substances status: Secondary | ICD-10-CM | POA: Diagnosis not present

## 2022-09-06 DIAGNOSIS — J320 Chronic maxillary sinusitis: Secondary | ICD-10-CM | POA: Diagnosis present

## 2022-09-06 DIAGNOSIS — K851 Biliary acute pancreatitis without necrosis or infection: Secondary | ICD-10-CM | POA: Diagnosis present

## 2022-09-06 DIAGNOSIS — K7689 Other specified diseases of liver: Secondary | ICD-10-CM | POA: Diagnosis present

## 2022-09-06 DIAGNOSIS — K8000 Calculus of gallbladder with acute cholecystitis without obstruction: Secondary | ICD-10-CM | POA: Diagnosis present

## 2022-09-06 DIAGNOSIS — Z885 Allergy status to narcotic agent status: Secondary | ICD-10-CM | POA: Diagnosis not present

## 2022-09-06 DIAGNOSIS — F32A Depression, unspecified: Secondary | ICD-10-CM | POA: Diagnosis present

## 2022-09-06 DIAGNOSIS — J4520 Mild intermittent asthma, uncomplicated: Secondary | ICD-10-CM | POA: Diagnosis present

## 2022-09-06 DIAGNOSIS — R7401 Elevation of levels of liver transaminase levels: Secondary | ICD-10-CM | POA: Diagnosis present

## 2022-09-06 DIAGNOSIS — Z87891 Personal history of nicotine dependence: Secondary | ICD-10-CM | POA: Diagnosis not present

## 2022-09-06 LAB — COMPREHENSIVE METABOLIC PANEL
ALT: 367 U/L — ABNORMAL HIGH (ref 0–44)
AST: 310 U/L — ABNORMAL HIGH (ref 15–41)
Albumin: 3.2 g/dL — ABNORMAL LOW (ref 3.5–5.0)
Alkaline Phosphatase: 178 U/L — ABNORMAL HIGH (ref 38–126)
Anion gap: 6 (ref 5–15)
BUN: 9 mg/dL (ref 6–20)
CO2: 25 mmol/L (ref 22–32)
Calcium: 8.4 mg/dL — ABNORMAL LOW (ref 8.9–10.3)
Chloride: 109 mmol/L (ref 98–111)
Creatinine, Ser: 0.81 mg/dL (ref 0.44–1.00)
GFR, Estimated: >60 mL/min (ref >60)
Glucose, Bld: 106 mg/dL — ABNORMAL HIGH (ref 70–99)
Potassium: 3.6 mmol/L (ref 3.5–5.1)
Sodium: 140 mmol/L (ref 135–145)
Total Bilirubin: 1.1 mg/dL (ref 0.3–1.2)
Total Protein: 5.6 g/dL — ABNORMAL LOW (ref 6.5–8.1)

## 2022-09-06 LAB — CBC
HCT: 33.9 % — ABNORMAL LOW (ref 36.0–46.0)
Hemoglobin: 11.8 g/dL — ABNORMAL LOW (ref 12.0–15.0)
MCH: 32.4 pg (ref 26.0–34.0)
MCHC: 34.8 g/dL (ref 30.0–36.0)
MCV: 93.1 fL (ref 80.0–100.0)
Platelets: 236 10*3/uL (ref 150–400)
RBC: 3.64 MIL/uL — ABNORMAL LOW (ref 3.87–5.11)
RDW: 12.5 % (ref 11.5–15.5)
WBC: 4.2 10*3/uL (ref 4.0–10.5)
nRBC: 0 % (ref 0.0–0.2)

## 2022-09-06 LAB — LIPASE, BLOOD: Lipase: 126 U/L — ABNORMAL HIGH (ref 11–51)

## 2022-09-06 LAB — HIV ANTIBODY (ROUTINE TESTING W REFLEX): HIV Screen 4th Generation wRfx: NONREACTIVE

## 2022-09-06 LAB — GLUCOSE, CAPILLARY: Glucose-Capillary: 98 mg/dL (ref 70–99)

## 2022-09-06 LAB — MRSA NEXT GEN BY PCR, NASAL: MRSA by PCR Next Gen: DETECTED — AB

## 2022-09-06 MED ORDER — SODIUM CHLORIDE 0.9 % IV SOLN: INTRAVENOUS | Status: DC

## 2022-09-06 MED ORDER — CLONAZEPAM 1 MG PO TABS: 1.0000 mg | ORAL_TABLET | Freq: Every day | ORAL | Status: DC | PRN

## 2022-09-06 MED ORDER — ARMODAFINIL 200 MG PO TABS: 1.0000 | ORAL_TABLET | Freq: Every morning | ORAL | Status: DC

## 2022-09-06 MED ORDER — NALOXONE HCL 0.4 MG/ML IJ SOLN: 0.4000 mg | INTRAMUSCULAR | Status: DC | PRN

## 2022-09-06 MED ORDER — CITALOPRAM HYDROBROMIDE 20 MG PO TABS: 30.0000 mg | ORAL_TABLET | Freq: Every day | ORAL | Status: DC

## 2022-09-06 MED ORDER — ALBUTEROL SULFATE (2.5 MG/3ML) 0.083% IN NEBU: 2.5000 mg | INHALATION_SOLUTION | Freq: Four times a day (QID) | RESPIRATORY_TRACT | Status: DC | PRN

## 2022-09-06 MED ORDER — MUPIROCIN 2 % EX OINT
1.0000 | TOPICAL_OINTMENT | Freq: Two times a day (BID) | CUTANEOUS | Status: DC
  Administered 2022-09-06 – 2022-09-07 (×3): 1 via NASAL
  Filled 2022-09-06: qty 22

## 2022-09-06 MED ORDER — PRAMIPEXOLE DIHYDROCHLORIDE 0.25 MG PO TABS
0.7500 mg | ORAL_TABLET | Freq: Every day | ORAL | Status: DC
  Administered 2022-09-06 – 2022-09-07 (×2): 0.75 mg via ORAL
  Filled 2022-09-06 (×2): qty 3

## 2022-09-06 MED ORDER — SODIUM CHLORIDE 0.9 % IV SOLN
2.0000 g | INTRAVENOUS | Status: DC
  Administered 2022-09-06 – 2022-09-07 (×2): 2 g via INTRAVENOUS
  Filled 2022-09-06 (×2): qty 20

## 2022-09-06 MED ORDER — TOPIRAMATE 25 MG PO TABS
75.0000 mg | ORAL_TABLET | Freq: Every day | ORAL | Status: DC
  Administered 2022-09-06: 25 mg via ORAL
  Filled 2022-09-06: qty 3

## 2022-09-06 MED ORDER — HYDROMORPHONE HCL 1 MG/ML IJ SOLN
0.5000 mg | INTRAMUSCULAR | Status: DC | PRN
  Administered 2022-09-06 (×4): 0.5 mg via INTRAVENOUS
  Filled 2022-09-06 (×4): qty 0.5

## 2022-09-06 MED ORDER — ORAL CARE MOUTH RINSE: 15.0000 mL | OROMUCOSAL | Status: DC | PRN

## 2022-09-06 MED ORDER — CITALOPRAM HYDROBROMIDE 20 MG PO TABS
30.0000 mg | ORAL_TABLET | Freq: Every day | ORAL | Status: DC
  Administered 2022-09-06 – 2022-09-07 (×2): 30 mg via ORAL
  Filled 2022-09-06 (×2): qty 2

## 2022-09-06 MED ORDER — CHLORHEXIDINE GLUCONATE CLOTH 2 % EX PADS
6.0000 | MEDICATED_PAD | Freq: Every day | CUTANEOUS | Status: DC
  Administered 2022-09-07: 6 via TOPICAL

## 2022-09-06 MED ORDER — METRONIDAZOLE 500 MG/100ML IV SOLN
500.0000 mg | Freq: Two times a day (BID) | INTRAVENOUS | Status: DC
  Administered 2022-09-06 – 2022-09-07 (×4): 500 mg via INTRAVENOUS
  Filled 2022-09-06 (×5): qty 100

## 2022-09-06 MED ORDER — TOPIRAMATE 25 MG PO TABS: 25.0000 mg | ORAL_TABLET | Freq: Every day | ORAL | Status: DC

## 2022-09-06 MED ORDER — ONDANSETRON HCL 4 MG/2ML IJ SOLN
4.0000 mg | Freq: Four times a day (QID) | INTRAMUSCULAR | Status: DC | PRN
  Administered 2022-09-06 (×2): 4 mg via INTRAVENOUS
  Filled 2022-09-06 (×2): qty 2

## 2022-09-06 MED ORDER — PRAMIPEXOLE DIHYDROCHLORIDE 0.25 MG PO TABS: 0.2500 mg | ORAL_TABLET | Freq: Every day | ORAL | Status: DC

## 2022-09-06 NOTE — Anesthesia Preprocedure Evaluation (Signed)
Anesthesia Evaluation  Patient identified by MRN, date of birth, ID band Patient awake    Reviewed: Allergy & Precautions, NPO status , Patient's Chart, lab work & pertinent test results  History of Anesthesia Complications Negative for: history of anesthetic complications  Airway Mallampati: II  TM Distance: >3 FB Neck ROM: Full    Dental no notable dental hx. (+) Dental Advisory Given   Pulmonary former smoker   Pulmonary exam normal        Cardiovascular negative cardio ROS Normal cardiovascular exam     Neuro/Psych negative neurological ROS     GI/Hepatic Neg liver ROS,,,  Endo/Other  negative endocrine ROS    Renal/GU negative Renal ROS     Musculoskeletal negative musculoskeletal ROS (+)    Abdominal   Peds  Hematology  (+) Blood dyscrasia, anemia   Anesthesia Other Findings   Reproductive/Obstetrics                             Anesthesia Physical Anesthesia Plan  ASA: 2  Anesthesia Plan: General   Post-op Pain Management: Tylenol PO (pre-op)* and Toradol IV (intra-op)*   Induction:   PONV Risk Score and Plan: 4 or greater and Ondansetron, Dexamethasone, Midazolam and Scopolamine patch - Pre-op  Airway Management Planned: Oral ETT  Additional Equipment:   Intra-op Plan:   Post-operative Plan: Extubation in OR  Informed Consent: I have reviewed the patients History and Physical, chart, labs and discussed the procedure including the risks, benefits and alternatives for the proposed anesthesia with the patient or authorized representative who has indicated his/her understanding and acceptance.     Dental advisory given  Plan Discussed with: Anesthesiologist, CRNA and Surgeon  Anesthesia Plan Comments:        Anesthesia Quick Evaluation

## 2022-09-06 NOTE — H&P (Signed)
History and Physical    Christine Montoya [id number] DOB: 1976-02-28 DOA: 09/05/2022  PCP: Jefm Petty, MD  Patient coming from: [facility] ED  Chief Complaint: Abdominal pain  HPI: Christine Montoya is a 47 y.o. female with medical history significant of migraine/chronic daily headache, chronic maxillary sinusitis, allergic rhinitis, mild intermittent asthma, RLS presented to [facility] ED with epigastric/right upper quadrant abdominal pain and nausea.  Vital signs stable on arrival to the ED.  Labs showing no leukocytosis, AST 424, ALT 374, alk phos 188, T. bili 1.3, lipase 4916, troponin negative x 2, UA without signs of infection, urine pregnancy test negative.  Chest x-ray showing no active cardiopulmonary disease.  Right upper quadrant ultrasound showing gallbladder wall thickening and pericholecystic fluid with multiple stones.  CBD diameter 4 mm.  Stable hepatic cysts. ED PA consulted GI (Dr. Benson Norway) and general surgery (Dr. Georgette Dover).  Both GI and general surgery felt that the patient's pancreatic inflammation has to improve before pursuing cholecystectomy.  GI advised IV antibiotics.  Patient received ceftriaxone, metronidazole, Dilaudid, Zofran, and 1 L LR bolus.  Patient reports 1 week history of epigastric/right upper quadrant abdominal pain.  Pain was much worse today which prompted her to seek medical attention.  She has felt nauseous but has not vomited.  She has been able to tolerate p.o. intake at home.  Having regular bowel movements.  Denies fevers or chills.  Denies cough, shortness of breath, or chest pain.  Review of Systems:  Review of Systems  All other systems reviewed and are negative.   History reviewed. No pertinent past medical history.  Past Surgical History:  Procedure Laterality Date   HIP SURGERY Right    KNEE SURGERY     SHOULDER SURGERY       reports that she has quit smoking. She does not have any smokeless tobacco history on file. She reports current  alcohol use. She reports that she does not use drugs.  Allergies  Allergen Reactions   Morphine And Related Hives    Pt states she gets hives with IV morphine, severe with itching    History reviewed. No pertinent family history.  Prior to Admission medications   Not on File    Physical Exam: Vitals:   09/05/22 2300 09/05/22 2315 09/05/22 2330 09/05/22 2345  BP: 116/81 116/78 109/81 119/81  Pulse: (!) 58 62 62 (!) 54  Resp:    16  Temp:    97.7 F (36.5 C)  TempSrc:    Oral  SpO2: 98% 96% 97% 97%  Weight:      Height:        Physical Exam Vitals reviewed.  Constitutional:      General: She is not in acute distress. HENT:     Head: Normocephalic and atraumatic.  Eyes:     Extraocular Movements: Extraocular movements intact.  Cardiovascular:     Rate and Rhythm: Normal rate and regular rhythm.     Pulses: Normal pulses.  Pulmonary:     Effort: Pulmonary effort is normal. No respiratory distress.     Breath sounds: Normal breath sounds. No wheezing or rales.  Abdominal:     General: Bowel sounds are normal. There is no distension.     Palpations: Abdomen is soft.     Tenderness: There is abdominal tenderness. There is no guarding or rebound.     Comments: Epigastrium and right upper quadrant tender to palpation  Musculoskeletal:     Cervical back: Normal range of motion.  Right lower leg: No edema.     Left lower leg: No edema.  Skin:    General: Skin is warm and dry.  Neurological:     General: No focal deficit present.     Mental Status: She is alert and oriented to person, place, and time.     Labs on Admission: I have personally reviewed following labs and imaging studies  CBC: Recent Labs  Lab 09/05/22 1519  WBC 8.4  HGB 13.6  HCT 39.3  MCV 91.6  PLT 123456   Basic Metabolic Panel: Recent Labs  Lab 09/05/22 1519  NA 138  K 3.5  CL 107  CO2 23  GLUCOSE 106*  BUN 14  CREATININE 0.92  CALCIUM 8.9   GFR: Estimated Creatinine  Clearance: 68 mL/min (by C-G formula based on SCr of 0.92 mg/dL). Liver Function Tests: Recent Labs  Lab 09/05/22 1519  AST 424*  ALT 374*  ALKPHOS 188*  BILITOT 1.3*  PROT 7.3  ALBUMIN 4.2   Recent Labs  Lab 09/05/22 1519  LIPASE 4,916*   No results for input(s): "AMMONIA" in the last 168 hours. Coagulation Profile: No results for input(s): "INR", "PROTIME" in the last 168 hours. Cardiac Enzymes: No results for input(s): "CKTOTAL", "CKMB", "CKMBINDEX", "TROPONINI" in the last 168 hours. BNP (last 3 results) No results for input(s): "PROBNP" in the last 8760 hours. HbA1C: No results for input(s): "HGBA1C" in the last 72 hours. CBG: No results for input(s): "GLUCAP" in the last 168 hours. Lipid Profile: No results for input(s): "CHOL", "HDL", "LDLCALC", "TRIG", "CHOLHDL", "LDLDIRECT" in the last 72 hours. Thyroid Function Tests: No results for input(s): "TSH", "T4TOTAL", "FREET4", "T3FREE", "THYROIDAB" in the last 72 hours. Anemia Panel: No results for input(s): "VITAMINB12", "FOLATE", "FERRITIN", "TIBC", "IRON", "RETICCTPCT" in the last 72 hours. Urine analysis:    Component Value Date/Time   COLORURINE YELLOW 09/05/2022 1520   APPEARANCEUR CLEAR 09/05/2022 1520   LABSPEC 1.015 09/05/2022 1520   PHURINE 8.5 (H) 09/05/2022 1520   GLUCOSEU NEGATIVE 09/05/2022 1520   HGBUR NEGATIVE 09/05/2022 1520   BILIRUBINUR NEGATIVE 09/05/2022 1520   KETONESUR NEGATIVE 09/05/2022 1520   PROTEINUR NEGATIVE 09/05/2022 1520   UROBILINOGEN 0.2 01/13/2015 1752   NITRITE NEGATIVE 09/05/2022 1520   LEUKOCYTESUR NEGATIVE 09/05/2022 1520    Radiological Exams on Admission: US Abdomen Limited RUQ (LIVER/GB)  Result Date: 09/05/2022 CLINICAL DATA:  Epigastric pain EXAM: ULTRASOUND ABDOMEN LIMITED RIGHT UPPER QUADRANT COMPARISON:  Ultrasound July 2016.  CT August 2016.  MRI [place] 2017 FINDINGS: Gallbladder: Shadowing stones in the gallbladder. Gallbladder is distended. There is however wall  thickening and adjacent edema. Common bile duct: Diameter: 4 mm Liver: No focal lesion identified. Within normal limits in parenchymal echogenicity. Known benign-appearing hepatic cysts measuring 17 mm. Portal vein is patent on color Doppler imaging with normal direction of blood flow towards the liver. Other: None. IMPRESSION: Gallbladder wall thickening and pericholecystic fluid with multiple stones. Please correlate for other clinical findings of acute cholecystitis. No ductal dilatation. Stable hepatic cyst Electronically Signed   By: Jill Side M.D.   On: 09/05/2022 15:51   DG Chest 2 View  Result Date: 09/05/2022 CLINICAL DATA:  Pain EXAM: CHEST - 2 VIEW COMPARISON:  None Available. FINDINGS: The heart size and mediastinal contours are within normal limits. Both lungs are clear. The visualized skeletal structures are unremarkable. IMPRESSION: No active cardiopulmonary disease. Electronically Signed   By: Dorise Bullion III M.D.   On: 09/05/2022 15:28    EKG:  Independently reviewed.  Sinus rhythm, borderline T wave abnormalities.  No prior tracing for comparison.  Assessment and Plan  Acute cholecystitis and gallstone pancreatitis AST 424, ALT 374, alk phos 188, T. bili 1.3, lipase 4916.  No fever, leukocytosis, or signs of sepsis.  Right upper quadrant ultrasound showing gallbladder wall thickening and pericholecystic fluid with multiple stones.  CBD diameter 4 mm.  Stable hepatic cysts. GI and general surgery consulted by ED PA and felt that the patient's pancreatic inflammation has to improve before pursuing cholecystectomy.  GI advised IV antibiotics, continue ceftriaxone and metronidazole.  Keep n.p.o., IV fluid hydration, pain management, and antiemetic as needed.  CT ordered for further evaluation of pancreas.  Repeat CBC, CMP, and lipase in the morning.  Mild intermittent asthma Stable, no signs of acute exacerbation.  Albuterol as needed.  DVT prophylaxis: SCDs Code Status: Full Code  (discussed with the patient) Family Communication: No family available at this time. Consults called: GI and general surgery Level of care: Telemetry bed Admission status: It is my clinical opinion that admission to INPATIENT is reasonable and necessary because of the expectation that this patient will require hospital care that crosses at least 2 midnights to treat this condition based on the medical complexity of the problems presented.  Given the aforementioned information, the predictability of an adverse outcome is felt to be significant.   Shela Leff MD [facility]  If 7PM-7AM, please contact night-coverage [email or web address]  09/06/2022, 1:50 AM

## 2022-09-06 NOTE — Consult Note (Signed)
Reason for Consult: Gallstone pancreatitis Referring Physician: [facility]  Laelah Akerson HPI: This is a 47 year old female without PMH admitted for a gallstone pancreatitis.  She had epigastric pain two weeks prior to admission and it only lasted for 2 hours.  The pain resolved rapidly.  Another episode occurred this past Wednesday and it was a repeat of the index episode.  This Friday she ate a late lunch around 4 PM and then next morning at 8 AM she experienced severe epigastric pain, nausea, and vomiting.  In the ER her lipase was 4916 with an AST of 424 and 374.  TB was 1.3 and the AP was 188.  Her pain is better with pain control.  Imaging showed that her CBD was 4 mm on ultrasound.  Overnight her liver enzymes and TB improved.  History reviewed. No pertinent past medical history.  Past Surgical History:  Procedure Laterality Date   HIP SURGERY Right    KNEE SURGERY     SHOULDER SURGERY      History reviewed. No pertinent family history.  Social History:  reports that she has quit smoking. She does not have any smokeless tobacco history on file. She reports current alcohol use. She reports that she does not use drugs.  Allergies:  Allergies  Allergen Reactions   Morphine And Related Hives    Pt states she gets hives with IV morphine, severe with itching    Medications: Scheduled:  Chlorhexidine Gluconate Cloth  6 each Topical Q0600   mupirocin ointment  1 Application Nasal BID   Continuous:  sodium chloride 125 mL/hr at 09/06/22 0857   cefTRIAXone (ROCEPHIN)  IV     metronidazole 500 mg (09/06/22 0901)    Results for orders placed or performed during the hospital encounter of 09/05/22 (from the past 24 hour(s))  Lipase, blood     Status: Abnormal   Collection Time: 09/05/22  3:19 PM  Result Value Ref Range   Lipase 4,916 (H) 11 - 51 U/L  Comprehensive metabolic panel     Status: Abnormal   Collection Time: 09/05/22  3:19 PM  Result Value Ref Range   Sodium 138  135 - 145 mmol/L   Potassium 3.5 3.5 - 5.1 mmol/L   Chloride 107 98 - 111 mmol/L   CO2 23 22 - 32 mmol/L   Glucose, Bld 106 (H) 70 - 99 mg/dL   BUN 14 6 - 20 mg/dL   Creatinine, Ser 0.92 0.44 - 1.00 mg/dL   Calcium 8.9 8.9 - 10.3 mg/dL   Total Protein 7.3 6.5 - 8.1 g/dL   Albumin 4.2 3.5 - 5.0 g/dL   AST 424 (H) 15 - 41 U/L   ALT 374 (H) 0 - 44 U/L   Alkaline Phosphatase 188 (H) 38 - 126 U/L   Total Bilirubin 1.3 (H) 0.3 - 1.2 mg/dL   GFR, Estimated >60 >60 mL/min   Anion gap 8 5 - 15  CBC     Status: None   Collection Time: 09/05/22  3:19 PM  Result Value Ref Range   WBC 8.4 4.0 - 10.5 K/uL   RBC 4.29 3.87 - 5.11 MIL/uL   Hemoglobin 13.6 12.0 - 15.0 g/dL   HCT 39.3 36.0 - 46.0 %   MCV 91.6 80.0 - 100.0 fL   MCH 31.7 26.0 - 34.0 pg   MCHC 34.6 30.0 - 36.0 g/dL   RDW 12.4 11.5 - 15.5 %   Platelets 305 150 - 400 K/uL  nRBC 0.0 0.0 - 0.2 %  Troponin I (High Sensitivity)     Status: None   Collection Time: 09/05/22  3:19 PM  Result Value Ref Range   Troponin I (High Sensitivity) 2 <18 ng/L  Urinalysis, Routine w reflex microscopic -Urine, Clean Catch     Status: Abnormal   Collection Time: 09/05/22  3:20 PM  Result Value Ref Range   Color, Urine YELLOW YELLOW   APPearance CLEAR CLEAR   Specific Gravity, Urine 1.015 1.005 - 1.030   pH 8.5 (H) 5.0 - 8.0   Glucose, UA NEGATIVE NEGATIVE mg/dL   Hgb urine dipstick NEGATIVE NEGATIVE   Bilirubin Urine NEGATIVE NEGATIVE   Ketones, ur NEGATIVE NEGATIVE mg/dL   Protein, ur NEGATIVE NEGATIVE mg/dL   Nitrite NEGATIVE NEGATIVE   Leukocytes,Ua NEGATIVE NEGATIVE  Pregnancy, urine     Status: None   Collection Time: 09/05/22  3:20 PM  Result Value Ref Range   Preg Test, Ur NEGATIVE NEGATIVE  Troponin I (High Sensitivity)     Status: None   Collection Time: 09/05/22  7:50 PM  Result Value Ref Range   Troponin I (High Sensitivity) <2 <18 ng/L  MRSA Next Gen by PCR, Nasal     Status: Abnormal   Collection Time: 09/06/22  3:05 AM    Specimen: Nasal Mucosa; Nasal Swab  Result Value Ref Range   MRSA by PCR Next Gen DETECTED (A) NOT DETECTED  Comprehensive metabolic panel     Status: Abnormal   Collection Time: 09/06/22  4:29 AM  Result Value Ref Range   Sodium 140 135 - 145 mmol/L   Potassium 3.6 3.5 - 5.1 mmol/L   Chloride 109 98 - 111 mmol/L   CO2 25 22 - 32 mmol/L   Glucose, Bld 106 (H) 70 - 99 mg/dL   BUN 9 6 - 20 mg/dL   Creatinine, Ser 0.81 0.44 - 1.00 mg/dL   Calcium 8.4 (L) 8.9 - 10.3 mg/dL   Total Protein 5.6 (L) 6.5 - 8.1 g/dL   Albumin 3.2 (L) 3.5 - 5.0 g/dL   AST 310 (H) 15 - 41 U/L   ALT 367 (H) 0 - 44 U/L   Alkaline Phosphatase 178 (H) 38 - 126 U/L   Total Bilirubin 1.1 0.3 - 1.2 mg/dL   GFR, Estimated >60 >60 mL/min   Anion gap 6 5 - 15  CBC     Status: Abnormal   Collection Time: 09/06/22  4:29 AM  Result Value Ref Range   WBC 4.2 4.0 - 10.5 K/uL   RBC 3.64 (L) 3.87 - 5.11 MIL/uL   Hemoglobin 11.8 (L) 12.0 - 15.0 g/dL   HCT 33.9 (L) 36.0 - 46.0 %   MCV 93.1 80.0 - 100.0 fL   MCH 32.4 26.0 - 34.0 pg   MCHC 34.8 30.0 - 36.0 g/dL   RDW 12.5 11.5 - 15.5 %   Platelets 236 150 - 400 K/uL   nRBC 0.0 0.0 - 0.2 %  Lipase, blood     Status: Abnormal   Collection Time: 09/06/22  4:29 AM  Result Value Ref Range   Lipase 126 (H) 11 - 51 U/L     CT ABDOMEN PELVIS WO CONTRAST  Result Date: 09/06/2022 CLINICAL DATA:  Epigastric and right upper quadrant abdominal pain with nausea. EXAM: CT ABDOMEN AND PELVIS WITHOUT CONTRAST TECHNIQUE: Multidetector CT imaging of the abdomen and pelvis was performed following the standard protocol without IV contrast. RADIATION DOSE REDUCTION: This exam was performed  according to the departmental dose-optimization program which includes automated exposure control, adjustment of the mA and/or kV according to patient size and/or use of iterative reconstruction technique. COMPARISON:  Right upper quadrant ultrasound performed yesterday. CT scan 02/05/2015. Abdominal MRI  10/07/2015 FINDINGS: Lower chest: Dependent atelectasis noted in both lung bases. Hepatobiliary: No suspicious focal abnormality in the liver on this study without intravenous contrast. Marked gallbladder wall thickening noted with layering dependent sludge/stones. No intrahepatic or extrahepatic biliary dilation. Pancreas: No focal mass lesion. No dilatation of the main duct. No intraparenchymal cyst. No peripancreatic edema. Spleen: No splenomegaly. No focal mass lesion. Adrenals/Urinary Tract: No adrenal nodule or mass. Kidneys unremarkable. No evidence for hydroureter. The urinary bladder appears normal for the degree of distention. Stomach/Bowel: Stomach is unremarkable. No gastric wall thickening. No evidence of outlet obstruction. Duodenum is normally positioned as is the ligament of Treitz. No small bowel wall thickening. No small bowel dilatation. The terminal ileum is normal. The appendix is normal. No gross colonic mass. No colonic wall thickening. Vascular/Lymphatic: No abdominal aortic aneurysm. No abdominal aortic atherosclerotic calcification. There is no gastrohepatic or hepatoduodenal ligament lymphadenopathy. No retroperitoneal or mesenteric lymphadenopathy. No pelvic sidewall lymphadenopathy. Reproductive: IUD visualized in the uterus. There is no adnexal mass. Other: No intraperitoneal free fluid. Musculoskeletal: Fixation hardware noted proximal right femur, incompletely visualized. No worrisome lytic or sclerotic osseous abnormality. IMPRESSION: Marked gallbladder wall thickening with layering dependent sludge/stones. Imaging features suspicious for acute cholecystitis. No biliary dilatation. Electronically Signed   By: Misty Stanley M.D.   On: 09/06/2022 06:54   US Abdomen Limited RUQ (LIVER/GB)  Result Date: 09/05/2022 CLINICAL DATA:  Epigastric pain EXAM: ULTRASOUND ABDOMEN LIMITED RIGHT UPPER QUADRANT COMPARISON:  Ultrasound July 2016.  CT August 2016.  MRI [place] 2017 FINDINGS:  Gallbladder: Shadowing stones in the gallbladder. Gallbladder is distended. There is however wall thickening and adjacent edema. Common bile duct: Diameter: 4 mm Liver: No focal lesion identified. Within normal limits in parenchymal echogenicity. Known benign-appearing hepatic cysts measuring 17 mm. Portal vein is patent on color Doppler imaging with normal direction of blood flow towards the liver. Other: None. IMPRESSION: Gallbladder wall thickening and pericholecystic fluid with multiple stones. Please correlate for other clinical findings of acute cholecystitis. No ductal dilatation. Stable hepatic cyst Electronically Signed   By: Jill Side M.D.   On: 09/05/2022 15:51   DG Chest 2 View  Result Date: 09/05/2022 CLINICAL DATA:  Pain EXAM: CHEST - 2 VIEW COMPARISON:  None Available. FINDINGS: The heart size and mediastinal contours are within normal limits. Both lungs are clear. The visualized skeletal structures are unremarkable. IMPRESSION: No active cardiopulmonary disease. Electronically Signed   By: Dorise Bullion III M.D.   On: 09/05/2022 15:28    ROS:  As stated above in the HPI otherwise negative.  Blood pressure 109/84, pulse 67, temperature 97.9 F (36.6 C), temperature source Oral, resp. rate 14, height '5\' 5"'$  (1.651 m), weight 73.1 kg, SpO2 95 %.    PE: Gen: NAD, Alert and Oriented HEENT:  [place]/AT, EOMI Neck: Supple, no LAD Lungs: CTA Bilaterally CV: RRR without M/G/R ABD: Soft, NTND, +BS Ext: No C/C/E  Assessment/Plan: 1) Gallstone pancreatitis. 2) ? Acute cholecystitis. 3) Cholelithiasis.   The patient is stable and she appears to be improving.  There is no evidence of any bilary ductal obstruction.  She most likely passed the gallstones that migrated into the CBD.  Plan: 1) Continue with gentle IV hydration. 2) Pain control as needed. 3)  Surgical consultation for lap chole.  Tacha Manni D 09/06/2022, 9:37 AM

## 2022-09-06 NOTE — ED Notes (Signed)
[facility] at bedside. Report given. Pt stable for transport.

## 2022-09-06 NOTE — Consult Note (Signed)
CC: Gallstone pancreatitis  HPI: Christine Montoya is an 47 y.o. female who presented to [facility] with acute onset midepigastric pain that was burning and severe.  The pain did not radiate.  It began suddenly over the last day.  She reports 2 similar attacks over the last 2 to 3 weeks but shorter in duration.  This lasted longer prompting her to present for further evaluation.  Some nausea which has since resolved.  No fever or chills.  She was admitted to the medicine service and ultimately transferred here.  I was made aware of her this afternoon asked to see her in consultation.  She reports since being admitted she has had dramatic improvement in her abdominal pain.  Still somewhat sore in the midepigastrium but much better than yesterday.  Her boyfriend is at bedside.  She denies any prior abdominal or pelvic surgical history  History reviewed. No pertinent past medical history.  Past Surgical History:  Procedure Laterality Date   HIP SURGERY Right    KNEE SURGERY     SHOULDER SURGERY      History reviewed. No pertinent family history.  Social:  reports that she has quit smoking. She does not have any smokeless tobacco history on file. She reports current alcohol use. She reports that she does not use drugs.  Allergies:  Allergies  Allergen Reactions   Buprenorphine Hcl Hives    Pt states she gets hives with IV morphine, severe with itching   Morphine And Related Hives    Pt states she gets hives with IV morphine, severe with itching   Nitrofurantoin Other (See Comments)    GI intolerance     Medications: I have reviewed the patient's current medications.  Results for orders placed or performed during the hospital encounter of 09/05/22 (from the past 48 hour(s))  Lipase, blood     Status: Abnormal   Collection Time: 09/05/22  3:19 PM  Result Value Ref Range   Lipase 4,916 (H) 11 - 51 U/L    Comment: RESULTS CONFIRMED BY MANUAL DILUTION Performed at [facility], [address] [place], [place] [address]   Comprehensive metabolic panel     Status: Abnormal   Collection Time: 09/05/22  3:19 PM  Result Value Ref Range   Sodium 138 135 - 145 mmol/L   Potassium 3.5 3.5 - 5.1 mmol/L   Chloride 107 98 - 111 mmol/L   CO2 23 22 - 32 mmol/L   Glucose, Bld 106 (H) 70 - 99 mg/dL    Comment: Glucose reference range applies only to samples taken after fasting for at least 8 hours.   BUN 14 6 - 20 mg/dL   Creatinine, Ser 0.92 0.44 - 1.00 mg/dL   Calcium 8.9 8.9 - 10.3 mg/dL   Total Protein 7.3 6.5 - 8.1 g/dL   Albumin 4.2 3.5 - 5.0 g/dL   AST 424 (H) 15 - 41 U/L   ALT 374 (H) 0 - 44 U/L   Alkaline Phosphatase 188 (H) 38 - 126 U/L   Total Bilirubin 1.3 (H) 0.3 - 1.2 mg/dL   GFR, Estimated >60 >60 mL/min    Comment: (NOTE) Calculated using the CKD-EPI Creatinine Equation (2021)    Anion gap 8 5 - 15    Comment: Performed at [facility], [address] [place], [place] [address]  CBC     Status: None   Collection Time: 09/05/22  3:19 PM  Result Value  Ref Range   WBC 8.4 4.0 - 10.5 K/uL   RBC 4.29 3.87 - 5.11 MIL/uL   Hemoglobin 13.6 12.0 - 15.0 g/dL   HCT 39.3 36.0 - 46.0 %   MCV 91.6 80.0 - 100.0 fL   MCH 31.7 26.0 - 34.0 pg   MCHC 34.6 30.0 - 36.0 g/dL   RDW 12.4 11.5 - 15.5 %   Platelets 305 150 - 400 K/uL   nRBC 0.0 0.0 - 0.2 %    Comment: Performed at [facility], [address] [place], [place] [address]  Troponin I (High Sensitivity)     Status: None   Collection Time: 09/05/22  3:19 PM  Result Value Ref Range   Troponin I (High Sensitivity) 2 <18 ng/L    Comment: (NOTE) Elevated high sensitivity troponin I (hsTnI) values and significant  changes across serial measurements may suggest ACS but many other  chronic and acute conditions are known to elevate hsTnI results.  Refer to the "Links" section for chest pain algorithms and additional  guidance. Performed at [facility], [address] [place], [place] [address]   Urinalysis, Routine w reflex microscopic -Urine, Clean Catch     Status: Abnormal   Collection Time: 09/05/22  3:20 PM  Result Value Ref Range   Color, Urine YELLOW YELLOW   APPearance CLEAR CLEAR   Specific Gravity, Urine 1.015 1.005 - 1.030   pH 8.5 (H) 5.0 - 8.0   Glucose, UA NEGATIVE NEGATIVE mg/dL   Hgb urine dipstick NEGATIVE NEGATIVE   Bilirubin Urine NEGATIVE NEGATIVE   Ketones, ur NEGATIVE NEGATIVE mg/dL   Protein, ur NEGATIVE NEGATIVE mg/dL   Nitrite NEGATIVE NEGATIVE   Leukocytes,Ua NEGATIVE NEGATIVE    Comment: Microscopic not done on urines with negative protein, blood, leukocytes, nitrite, or glucose < 500 mg/dL. Performed at [facility], [address] [place], [place] [address]   Pregnancy, urine     Status: None   Collection Time: 09/05/22  3:20 PM  Result Value Ref Range   Preg Test, Ur NEGATIVE NEGATIVE    Comment:        THE SENSITIVITY OF THIS METHODOLOGY IS >20 mIU/mL. Performed at [facility], [address] [place], [place] [address]   Troponin I (High Sensitivity)     Status: None   Collection Time: 09/05/22  7:50 PM  Result Value Ref Range   Troponin I (High Sensitivity) <2 <18 ng/L    Comment: (NOTE) Elevated high sensitivity troponin I (hsTnI) values and significant  changes across serial measurements may suggest ACS but many other  chronic and acute conditions are known to elevate hsTnI results.  Refer to the "Links" section for chest pain algorithms and additional  guidance. Performed at [facility], [address] [place], [place] [address]   MRSA Next Gen by PCR, Nasal     Status: Abnormal   Collection Time: 09/06/22  3:05 AM   Specimen: Nasal Mucosa; Nasal Swab  Result Value Ref Range   MRSA by PCR Next Gen DETECTED (A) NOT DETECTED    Comment: RESULT CALLED TO, READ BACK BY AND VERIFIED WITH: S MYERS,RN'@0529'$  09/06/22 [place] (NOTE) The GeneXpert MRSA Assay  (FDA approved for NASAL specimens only), is one component of a comprehensive MRSA colonization surveillance program. It is not intended to diagnose MRSA infection nor to guide or monitor treatment for MRSA infections. Test performance is not FDA  approved in patients less than 50 years old. Performed at [facility], [place] [address], [place], [place] [address]   HIV Antibody (routine testing w rflx)     Status: None   Collection Time: 09/06/22  4:29 AM  Result Value Ref Range   HIV Screen 4th Generation wRfx Non Reactive Non Reactive    Comment: Performed at [facility], [place] [address], [place], [place] [address]  Comprehensive metabolic panel     Status: Abnormal   Collection Time: 09/06/22  4:29 AM  Result Value Ref Range   Sodium 140 135 - 145 mmol/L   Potassium 3.6 3.5 - 5.1 mmol/L   Chloride 109 98 - 111 mmol/L   CO2 25 22 - 32 mmol/L   Glucose, Bld 106 (H) 70 - 99 mg/dL    Comment: Glucose reference range applies only to samples taken after fasting for at least 8 hours.   BUN 9 6 - 20 mg/dL   Creatinine, Ser 0.81 0.44 - 1.00 mg/dL   Calcium 8.4 (L) 8.9 - 10.3 mg/dL   Total Protein 5.6 (L) 6.5 - 8.1 g/dL   Albumin 3.2 (L) 3.5 - 5.0 g/dL   AST 310 (H) 15 - 41 U/L   ALT 367 (H) 0 - 44 U/L   Alkaline Phosphatase 178 (H) 38 - 126 U/L   Total Bilirubin 1.1 0.3 - 1.2 mg/dL   GFR, Estimated >60 >60 mL/min    Comment: (NOTE) Calculated using the CKD-EPI Creatinine Equation (2021)    Anion gap 6 5 - 15    Comment: Performed at [facility], [place] [address] [place], [place] [address]  CBC     Status: Abnormal   Collection Time: 09/06/22  4:29 AM  Result Value Ref Range   WBC 4.2 4.0 - 10.5 K/uL   RBC 3.64 (L) 3.87 - 5.11 MIL/uL   Hemoglobin 11.8 (L) 12.0 - 15.0 g/dL   HCT 33.9 (L) 36.0 - 46.0 %   MCV 93.1 80.0 - 100.0 fL   MCH 32.4 26.0 - 34.0 pg   MCHC 34.8 30.0 - 36.0 g/dL   RDW 12.5 11.5 - 15.5 %   Platelets 236 150 - 400 K/uL   nRBC 0.0 0.0 - 0.2 %     Comment: Performed at [facility], [place] [address], [place], [place] [address]  Lipase, blood     Status: Abnormal   Collection Time: 09/06/22  4:29 AM  Result Value Ref Range   Lipase 126 (H) 11 - 51 U/L    Comment: Performed at [facility], [place] [address] [place], [place] [address]    CT ABDOMEN PELVIS WO CONTRAST  Result Date: 09/06/2022 CLINICAL DATA:  Epigastric and right upper quadrant abdominal pain with nausea. EXAM: CT ABDOMEN AND PELVIS WITHOUT CONTRAST TECHNIQUE: Multidetector CT imaging of the abdomen and pelvis was performed following the standard protocol without IV contrast. RADIATION DOSE REDUCTION: This exam was performed according to the departmental dose-optimization program which includes automated exposure control, adjustment of the mA and/or kV according to patient size and/or use of iterative reconstruction technique. COMPARISON:  Right upper quadrant ultrasound performed yesterday. CT scan 02/05/2015. Abdominal MRI 10/07/2015 FINDINGS: Lower chest: Dependent atelectasis noted in both lung bases. Hepatobiliary: No suspicious focal abnormality in the liver on this study without intravenous contrast. Marked gallbladder wall thickening noted with layering dependent sludge/stones. No intrahepatic or extrahepatic biliary dilation. Pancreas: No focal mass lesion. No dilatation  of the main duct. No intraparenchymal cyst. No peripancreatic edema. Spleen: No splenomegaly. No focal mass lesion. Adrenals/Urinary Tract: No adrenal nodule or mass. Kidneys unremarkable. No evidence for hydroureter. The urinary bladder appears normal for the degree of distention. Stomach/Bowel: Stomach is unremarkable. No gastric wall thickening. No evidence of outlet obstruction. Duodenum is normally positioned as is the ligament of Treitz. No small bowel wall thickening. No small bowel dilatation. The terminal ileum is normal. The appendix is normal. No gross colonic mass. No colonic wall thickening.  Vascular/Lymphatic: No abdominal aortic aneurysm. No abdominal aortic atherosclerotic calcification. There is no gastrohepatic or hepatoduodenal ligament lymphadenopathy. No retroperitoneal or mesenteric lymphadenopathy. No pelvic sidewall lymphadenopathy. Reproductive: IUD visualized in the uterus. There is no adnexal mass. Other: No intraperitoneal free fluid. Musculoskeletal: Fixation hardware noted proximal right femur, incompletely visualized. No worrisome lytic or sclerotic osseous abnormality. IMPRESSION: Marked gallbladder wall thickening with layering dependent sludge/stones. Imaging features suspicious for acute cholecystitis. No biliary dilatation. Electronically Signed   By: Misty Stanley M.D.   On: 09/06/2022 06:54   US Abdomen Limited RUQ (LIVER/GB)  Result Date: 09/05/2022 CLINICAL DATA:  Epigastric pain EXAM: ULTRASOUND ABDOMEN LIMITED RIGHT UPPER QUADRANT COMPARISON:  Ultrasound July 2016.  CT August 2016.  MRI [place] 2017 FINDINGS: Gallbladder: Shadowing stones in the gallbladder. Gallbladder is distended. There is however wall thickening and adjacent edema. Common bile duct: Diameter: 4 mm Liver: No focal lesion identified. Within normal limits in parenchymal echogenicity. Known benign-appearing hepatic cysts measuring 17 mm. Portal vein is patent on color Doppler imaging with normal direction of blood flow towards the liver. Other: None. IMPRESSION: Gallbladder wall thickening and pericholecystic fluid with multiple stones. Please correlate for other clinical findings of acute cholecystitis. No ductal dilatation. Stable hepatic cyst Electronically Signed   By: Jill Side M.D.   On: 09/05/2022 15:51   DG Chest 2 View  Result Date: 09/05/2022 CLINICAL DATA:  Pain EXAM: CHEST - 2 VIEW COMPARISON:  None Available. FINDINGS: The heart size and mediastinal contours are within normal limits. Both lungs are clear. The visualized skeletal structures are unremarkable. IMPRESSION: No active  cardiopulmonary disease. Electronically Signed   By: Dorise Bullion III M.D.   On: 09/05/2022 15:28    ROS - all of the below systems have been reviewed with the patient and positives are indicated with bold text General: chills, fever or night sweats Eyes: blurry vision or double vision ENT: epistaxis or sore throat Allergy/Immunology: itchy/watery eyes or nasal congestion Hematologic/Lymphatic: bleeding problems, blood clots or swollen lymph nodes Endocrine: temperature intolerance or unexpected weight changes Breast: new or changing breast lumps or nipple discharge Resp: cough, shortness of breath, or wheezing CV: chest pain or dyspnea on exertion GI: as per HPI GU: dysuria, trouble voiding, or hematuria MSK: joint pain or joint stiffness Neuro: TIA or stroke symptoms Derm: pruritus and skin lesion changes Psych: anxiety and depression  PE Blood pressure 117/74, pulse 67, temperature 98.3 F (36.8 C), temperature source Oral, resp. rate 16, height '5\' 5"'$  (1.651 m), weight 73.1 kg, SpO2 98 %. Constitutional: NAD; conversant Eyes: Moist conjunctiva; anicteric Lungs: Normal respiratory effort CV: RRR GI: Abd soft, minimal MEG tenderness, no RUQ tenderness; nondistended MSK: Normal range of motion of extremities Psychiatric: Appropriate affect  Results for orders placed or performed during the hospital encounter of 09/05/22 (from the past 48 hour(s))  Lipase, blood     Status: Abnormal   Collection Time: 09/05/22  3:19 PM  Result Value Ref Range  Lipase 4,916 (H) 11 - 51 U/L    Comment: RESULTS CONFIRMED BY MANUAL DILUTION Performed at [facility], [address] [place], [place] [address]   Comprehensive metabolic panel     Status: Abnormal   Collection Time: 09/05/22  3:19 PM  Result Value Ref Range   Sodium 138 135 - 145 mmol/L   Potassium 3.5 3.5 - 5.1 mmol/L   Chloride 107 98 - 111 mmol/L   CO2 23 22 - 32 mmol/L   Glucose, Bld 106 (H) 70 - 99 mg/dL     Comment: Glucose reference range applies only to samples taken after fasting for at least 8 hours.   BUN 14 6 - 20 mg/dL   Creatinine, Ser 0.92 0.44 - 1.00 mg/dL   Calcium 8.9 8.9 - 10.3 mg/dL   Total Protein 7.3 6.5 - 8.1 g/dL   Albumin 4.2 3.5 - 5.0 g/dL   AST 424 (H) 15 - 41 U/L   ALT 374 (H) 0 - 44 U/L   Alkaline Phosphatase 188 (H) 38 - 126 U/L   Total Bilirubin 1.3 (H) 0.3 - 1.2 mg/dL   GFR, Estimated >60 >60 mL/min    Comment: (NOTE) Calculated using the CKD-EPI Creatinine Equation (2021)    Anion gap 8 5 - 15    Comment: Performed at [facility], [address] [place], [place] [address]  CBC     Status: None   Collection Time: 09/05/22  3:19 PM  Result Value Ref Range   WBC 8.4 4.0 - 10.5 K/uL   RBC 4.29 3.87 - 5.11 MIL/uL   Hemoglobin 13.6 12.0 - 15.0 g/dL   HCT 39.3 36.0 - 46.0 %   MCV 91.6 80.0 - 100.0 fL   MCH 31.7 26.0 - 34.0 pg   MCHC 34.6 30.0 - 36.0 g/dL   RDW 12.4 11.5 - 15.5 %   Platelets 305 150 - 400 K/uL   nRBC 0.0 0.0 - 0.2 %    Comment: Performed at [facility], [address] [place], [place] [address]  Troponin I (High Sensitivity)     Status: None   Collection Time: 09/05/22  3:19 PM  Result Value Ref Range   Troponin I (High Sensitivity) 2 <18 ng/L    Comment: (NOTE) Elevated high sensitivity troponin I (hsTnI) values and significant  changes across serial measurements may suggest ACS but many other  chronic and acute conditions are known to elevate hsTnI results.  Refer to the "Links" section for chest pain algorithms and additional  guidance. Performed at [facility], [address] [place], [place] [address]   Urinalysis, Routine w reflex microscopic -Urine, Clean Catch     Status: Abnormal   Collection Time: 09/05/22  3:20 PM  Result Value Ref Range   Color, Urine YELLOW YELLOW   APPearance CLEAR CLEAR   Specific Gravity, Urine 1.015 1.005 - 1.030   pH 8.5 (H) 5.0 - 8.0   Glucose, UA NEGATIVE  NEGATIVE mg/dL   Hgb urine dipstick NEGATIVE NEGATIVE   Bilirubin Urine NEGATIVE NEGATIVE   Ketones, ur NEGATIVE NEGATIVE mg/dL   Protein, ur NEGATIVE NEGATIVE mg/dL   Nitrite NEGATIVE NEGATIVE   Leukocytes,Ua NEGATIVE NEGATIVE    Comment: Microscopic not done on urines with negative protein, blood, leukocytes, nitrite, or glucose < 500 mg/dL. Performed at [facility], [address] [place], [place] [address]   Pregnancy, urine  Status: None   Collection Time: 09/05/22  3:20 PM  Result Value Ref Range   Preg Test, Ur NEGATIVE NEGATIVE    Comment:        THE SENSITIVITY OF THIS METHODOLOGY IS >20 mIU/mL. Performed at [facility], [address] [place], [place] [address]   Troponin I (High Sensitivity)     Status: None   Collection Time: 09/05/22  7:50 PM  Result Value Ref Range   Troponin I (High Sensitivity) <2 <18 ng/L    Comment: (NOTE) Elevated high sensitivity troponin I (hsTnI) values and significant  changes across serial measurements may suggest ACS but many other  chronic and acute conditions are known to elevate hsTnI results.  Refer to the "Links" section for chest pain algorithms and additional  guidance. Performed at [facility], [address] [place], [place] [address]   MRSA Next Gen by PCR, Nasal     Status: Abnormal   Collection Time: 09/06/22  3:05 AM   Specimen: Nasal Mucosa; Nasal Swab  Result Value Ref Range   MRSA by PCR Next Gen DETECTED (A) NOT DETECTED    Comment: RESULT CALLED TO, READ BACK BY AND VERIFIED WITH: S MYERS,RN'@0529'$  09/06/22 [place] (NOTE) The GeneXpert MRSA Assay (FDA approved for NASAL specimens only), is one component of a comprehensive MRSA colonization surveillance program. It is not intended to diagnose MRSA infection nor to guide or monitor treatment for MRSA infections. Test performance is not FDA approved in patients less than 35 years old. Performed at [facility], [place]  [address], [place], [place] [address]   HIV Antibody (routine testing w rflx)     Status: None   Collection Time: 09/06/22  4:29 AM  Result Value Ref Range   HIV Screen 4th Generation wRfx Non Reactive Non Reactive    Comment: Performed at [facility], [place] [address] [place], [place] [address]  Comprehensive metabolic panel     Status: Abnormal   Collection Time: 09/06/22  4:29 AM  Result Value Ref Range   Sodium 140 135 - 145 mmol/L   Potassium 3.6 3.5 - 5.1 mmol/L   Chloride 109 98 - 111 mmol/L   CO2 25 22 - 32 mmol/L   Glucose, Bld 106 (H) 70 - 99 mg/dL    Comment: Glucose reference range applies only to samples taken after fasting for at least 8 hours.   BUN 9 6 - 20 mg/dL   Creatinine, Ser 0.81 0.44 - 1.00 mg/dL   Calcium 8.4 (L) 8.9 - 10.3 mg/dL   Total Protein 5.6 (L) 6.5 - 8.1 g/dL   Albumin 3.2 (L) 3.5 - 5.0 g/dL   AST 310 (H) 15 - 41 U/L   ALT 367 (H) 0 - 44 U/L   Alkaline Phosphatase 178 (H) 38 - 126 U/L   Total Bilirubin 1.1 0.3 - 1.2 mg/dL   GFR, Estimated >60 >60 mL/min    Comment: (NOTE) Calculated using the CKD-EPI Creatinine Equation (2021)    Anion gap 6 5 - 15    Comment: Performed at [facility], [place] [address] [place] [address]  CBC     Status: Abnormal   Collection Time: 09/06/22  4:29 AM  Result Value Ref Range   WBC 4.2 4.0 - 10.5 K/uL   RBC 3.64 (L) 3.87 - 5.11 MIL/uL   Hemoglobin 11.8 (L) 12.0 - 15.0 g/dL   HCT 33.9 (L) 36.0 -  46.0 %   MCV 93.1 80.0 - 100.0 fL   MCH 32.4 26.0 - 34.0 pg   MCHC 34.8 30.0 - 36.0 g/dL   RDW 12.5 11.5 - 15.5 %   Platelets 236 150 - 400 K/uL   nRBC 0.0 0.0 - 0.2 %    Comment: Performed at [place] [address], [place], [place] [address]  Lipase, blood     Status: Abnormal   Collection Time: 09/06/22  4:29 AM  Result Value Ref Range   Lipase 126 (H) 11 - 51 U/L    Comment: Performed at [facility], [place] [address], [place], [place] [address]    CT ABDOMEN PELVIS WO  CONTRAST  Result Date: 09/06/2022 CLINICAL DATA:  Epigastric and right upper quadrant abdominal pain with nausea. EXAM: CT ABDOMEN AND PELVIS WITHOUT CONTRAST TECHNIQUE: Multidetector CT imaging of the abdomen and pelvis was performed following the standard protocol without IV contrast. RADIATION DOSE REDUCTION: This exam was performed according to the departmental dose-optimization program which includes automated exposure control, adjustment of the mA and/or kV according to patient size and/or use of iterative reconstruction technique. COMPARISON:  Right upper quadrant ultrasound performed yesterday. CT scan 02/05/2015. Abdominal MRI 10/07/2015 FINDINGS: Lower chest: Dependent atelectasis noted in both lung bases. Hepatobiliary: No suspicious focal abnormality in the liver on this study without intravenous contrast. Marked gallbladder wall thickening noted with layering dependent sludge/stones. No intrahepatic or extrahepatic biliary dilation. Pancreas: No focal mass lesion. No dilatation of the main duct. No intraparenchymal cyst. No peripancreatic edema. Spleen: No splenomegaly. No focal mass lesion. Adrenals/Urinary Tract: No adrenal nodule or mass. Kidneys unremarkable. No evidence for hydroureter. The urinary bladder appears normal for the degree of distention. Stomach/Bowel: Stomach is unremarkable. No gastric wall thickening. No evidence of outlet obstruction. Duodenum is normally positioned as is the ligament of Treitz. No small bowel wall thickening. No small bowel dilatation. The terminal ileum is normal. The appendix is normal. No gross colonic mass. No colonic wall thickening. Vascular/Lymphatic: No abdominal aortic aneurysm. No abdominal aortic atherosclerotic calcification. There is no gastrohepatic or hepatoduodenal ligament lymphadenopathy. No retroperitoneal or mesenteric lymphadenopathy. No pelvic sidewall lymphadenopathy. Reproductive: IUD visualized in the uterus. There is no adnexal mass.  Other: No intraperitoneal free fluid. Musculoskeletal: Fixation hardware noted proximal right femur, incompletely visualized. No worrisome lytic or sclerotic osseous abnormality. IMPRESSION: Marked gallbladder wall thickening with layering dependent sludge/stones. Imaging features suspicious for acute cholecystitis. No biliary dilatation. Electronically Signed   By: Misty Stanley M.D.   On: 09/06/2022 06:54   US Abdomen Limited RUQ (LIVER/GB)  Result Date: 09/05/2022 CLINICAL DATA:  Epigastric pain EXAM: ULTRASOUND ABDOMEN LIMITED RIGHT UPPER QUADRANT COMPARISON:  Ultrasound July 2016.  CT August 2016.  MRI [place] 2017 FINDINGS: Gallbladder: Shadowing stones in the gallbladder. Gallbladder is distended. There is however wall thickening and adjacent edema. Common bile duct: Diameter: 4 mm Liver: No focal lesion identified. Within normal limits in parenchymal echogenicity. Known benign-appearing hepatic cysts measuring 17 mm. Portal vein is patent on color Doppler imaging with normal direction of blood flow towards the liver. Other: None. IMPRESSION: Gallbladder wall thickening and pericholecystic fluid with multiple stones. Please correlate for other clinical findings of acute cholecystitis. No ductal dilatation. Stable hepatic cyst Electronically Signed   By: Jill Side M.D.   On: 09/05/2022 15:51   DG Chest 2 View  Result Date: 09/05/2022 CLINICAL DATA:  Pain EXAM: CHEST - 2 VIEW COMPARISON:  None Available. FINDINGS:  The heart size and mediastinal contours are within normal limits. Both lungs are clear. The visualized skeletal structures are unremarkable. IMPRESSION: No active cardiopulmonary disease. Electronically Signed   By: Dorise Bullion III M.D.   On: 09/05/2022 15:28    A/P: Christine Montoya is an 47 y.o. female with presumably gallstone pancreatitis  -Lipase has nearly normalized and her symptoms are much improved, close to resolution.  We spent time discussing the relevant anatomy and physiology  of the GI tract and pathophysiology of gallstone/biliary pancreatitis.  We discussed the high risk for recurrence in the short-term and recommendations for surgery prior to discharge to reduce the risk of subsequent gallbladder related complications or recurrent pancreatitis. -It sounds as though she is likely had 2 other similar bouts of likely milder versions of pancreatitis of the last 2 to 3 weeks.  She expresses interest in proceeding with surgery prior to discharge to remove her gallbladder.  -We spent time today reviewing the procedure and general expectations.  We discussed that my partner, Dr. Ninfa Linden, will be here for group next week as our acute care surgeon.  We discussed that she would meet discussed with him prior as well.  -All of her questions were answered to her satisfaction, she expressed understanding, and agreement with the plan.  -NPO after midnight tonight.  I spent a total of 65 minutes in both face-to-face and non-face-to-face activities, excluding procedures performed, for this visit on the date of this encounter.  Nadeen Landau, [facility], [place]

## 2022-09-06 NOTE — Plan of Care (Signed)
  Problem: Clinical Measurements: Goal: Respiratory complications will improve Outcome: Progressing   Problem: Clinical Measurements: Goal: Cardiovascular complication will be avoided Outcome: Progressing   Problem: Activity: Goal: Risk for activity intolerance will decrease Outcome: Progressing   Problem: Elimination: Goal: Will not experience complications related to bowel motility Outcome: Progressing   Problem: Elimination: Goal: Will not experience complications related to urinary retention Outcome: Progressing   Problem: Pain Managment: Goal: General experience of comfort will improve Outcome: Progressing

## 2022-09-06 NOTE — Plan of Care (Signed)
  Problem: Education: Goal: Knowledge of General Education information will improve Description: Including pain rating scale, medication(s)/side effects and non-pharmacologic comfort measures Outcome: Progressing   Problem: Clinical Measurements: Goal: Ability to maintain clinical measurements within normal limits will improve Outcome: Progressing Goal: Will remain free from infection Outcome: Progressing Goal: Diagnostic test results will improve Outcome: Progressing Goal: Respiratory complications will improve Outcome: Progressing Goal: Cardiovascular complication will be avoided Outcome: Progressing   Problem: Nutrition: Goal: Adequate nutrition will be maintained Outcome: Not Progressing   Problem: Elimination: Goal: Will not experience complications related to bowel motility Outcome: Progressing Goal: Will not experience complications related to urinary retention Outcome: Progressing   Problem: Pain Managment: Goal: General experience of comfort will improve Outcome: Progressing   Problem: Skin Integrity: Goal: Risk for impaired skin integrity will decrease Outcome: Progressing   Problem: Education: Goal: Knowledge of Pancreatitis treatment and prevention will improve Outcome: Progressing

## 2022-09-06 NOTE — Progress Notes (Signed)
PROGRESS NOTE  Christine Montoya [id number] DOB: 06/01/1976   PCP: Jefm Petty, MD  Patient is from: Home  DOA: 09/05/2022 LOS: 0  Chief complaints Chief Complaint  Patient presents with   Abdominal Pain     Brief Narrative / Interim history: 47 year old F with PMH of migraine headache, chronic sinusitis, allergy, mild intermittent asthma and RLS presenting with epigastric and RUQ pain as well as nausea for 1 week, and admitted for acute cholecystitis and acute pancreatitis.  In ED, stable vitals.  No leukocytosis.  Mildly elevated transaminitis.  Lipase elevated to 4916.  Troponin negative x 2.  Urine pregnancy test negative.  UA and CXR without significant finding.  RUQ US showed gallbladder wall thickening and pericholecystic fluid with multiple stones.  No CBD dilation.  GI and general surgery consulted.  CT abdomen and pelvis ordered.  Patient was started on IV fluid and IV antibiotics and admitted.  CT abdomen and pelvis suggests acute cholecystitis but no radiologic evidence of pancreatitis.  Lipase down to 126.  LFT improving as well.   Subjective: Seen and examined earlier this morning.  No major events overnight of this morning.  Reports improvement in her pain.  Rates her pain 3/10.  It was 10+/10 on admission.  Some nausea but no emesis.  Objective: Vitals:   09/05/22 2345 09/06/22 0120 09/06/22 0554 09/06/22 0729  BP: 119/81 119/79 103/74 109/84  Pulse: (!) 54 67 63 67  Resp: '16 17 16 14  '$ Temp: 97.7 F (36.5 C) 98.1 F (36.7 C) 97.6 F (36.4 C) 97.9 F (36.6 C)  TempSrc: Oral Oral Oral Oral  SpO2: 97% 97% 97% 95%  Weight:  73.1 kg    Height:  '5\' 5"'$  (1.651 m)      Examination:  GENERAL: No apparent distress.  Nontoxic. HEENT: MMM.  Vision and hearing grossly intact.  NECK: Supple.  No apparent JVD.  RESP:  No IWOB.  Fair aeration bilaterally. CVS:  RRR. Heart sounds normal.  ABD/GI/GU: BS+. Abd soft.  RUQ tenderness.  Positive Murphy. MSK/EXT:  Moves  extremities. No apparent deformity. No edema.  SKIN: no apparent skin lesion or wound NEURO: Awake, alert and oriented appropriately.  No apparent focal neuro deficit. PSYCH: Calm. Normal affect.   Procedures:  None  Microbiology summarized: MRSA PCR screen positive.  Assessment and plan: Principal Problem:   Acute cholecystitis Active Problems:   Acute gallstone pancreatitis   Mild intermittent asthma   Normocytic anemia   Acute calculus cholecystitis: RUQ [place] and CT abdomen and pelvis suggest this acute calculus cholecystitis.  Has no fever or leukocytosis but elevated LFT and lipase which are improving.  RUQ tenderness with positive Murphy's on exam.  Significant improvement in his symptoms including pain. -Continue IV antibiotics and IV fluid -Antiemetics and analgesics -N.p.o. pending GI and general surgery input  Elevated lipase: Markedly elevated to 4968 on presentation but quickly improved to 126.  No radiologic evidence of pancreatitis on CT.  Along with elevated LFT, she could have passed biliary stone. -GI consulted  Normocytic anemia: Slight drop in Hgb likely dilutional. Recent Labs    09/05/22 1519 09/06/22 0429  HGB 13.6 11.8*  -Check anemia panel in the morning -Recheck CBC in the morning   Mild intermittent asthma: Stable. -Continue bronchodilators  History of sinusitis/allergic: Does not seem to be on medication but med rec pending.  History of migraine headaches/depression: Stable.  -Continue home medication after med rec if she is on any.  Body mass index  is 26.82 kg/m.           DVT prophylaxis:  SCDs Start: 09/06/22 0243  Code Status: Full code Family Communication: None at bedside Level of care: Med-Surg Status is: Inpatient Remains inpatient appropriate because: Due to acute calculus cholecystitis and acute pancreatitis   Final disposition: Likely home once medically cleared Consultants:  GI General surgery  35 minutes with  more than 50% spent in reviewing records, counseling patient/family and coordinating care.   Sch Meds:  Scheduled Meds:  Chlorhexidine Gluconate Cloth  6 each Topical Q0600   mupirocin ointment  1 Application Nasal BID   Continuous Infusions:  sodium chloride 125 mL/hr at 09/06/22 0857   cefTRIAXone (ROCEPHIN)  IV     metronidazole 500 mg (09/06/22 0901)   PRN Meds:.albuterol, HYDROmorphone (DILAUDID) injection, naLOXone (NARCAN)  injection, ondansetron (ZOFRAN) IV, mouth rinse  Antimicrobials: Anti-infectives (From admission, onward)    Start     Dose/Rate Route Frequency Ordered Stop   09/06/22 2200  cefTRIAXone (ROCEPHIN) 2 g in sodium chloride 0.9 % 100 mL IVPB        2 g 200 mL/hr over 30 Minutes Intravenous Every 24 hours 09/06/22 0245     09/06/22 1000  metroNIDAZOLE (FLAGYL) IVPB 500 mg        500 mg 100 mL/hr over 60 Minutes Intravenous Every 12 hours 09/06/22 0245     09/05/22 2300  metroNIDAZOLE (FLAGYL) IVPB 500 mg        500 mg 100 mL/hr over 60 Minutes Intravenous  Once 09/05/22 2250 09/06/22 0115   09/05/22 2215  cefTRIAXone (ROCEPHIN) 2 g in sodium chloride 0.9 % 100 mL IVPB        2 g 200 mL/hr over 30 Minutes Intravenous  Once 09/05/22 2200 09/05/22 2323        I have personally reviewed the following labs and images: CBC: Recent Labs  Lab 09/05/22 1519 09/06/22 0429  WBC 8.4 4.2  HGB 13.6 11.8*  HCT 39.3 33.9*  MCV 91.6 93.1  PLT 305 236   BMP &GFR Recent Labs  Lab 09/05/22 1519 09/06/22 0429  NA 138 140  K 3.5 3.6  CL 107 109  CO2 23 25  GLUCOSE 106* 106*  BUN 14 9  CREATININE 0.92 0.81  CALCIUM 8.9 8.4*   Estimated Creatinine Clearance: 85.9 mL/min (by C-G formula based on SCr of 0.81 mg/dL). Liver & Pancreas: Recent Labs  Lab 09/05/22 1519 09/06/22 0429  AST 424* 310*  ALT 374* 367*  ALKPHOS 188* 178*  BILITOT 1.3* 1.1  PROT 7.3 5.6*  ALBUMIN 4.2 3.2*   Recent Labs  Lab 09/05/22 1519 09/06/22 0429  LIPASE 4,916* 126*    No results for input(s): "AMMONIA" in the last 168 hours. Diabetic: No results for input(s): "HGBA1C" in the last 72 hours. No results for input(s): "GLUCAP" in the last 168 hours. Cardiac Enzymes: No results for input(s): "CKTOTAL", "CKMB", "CKMBINDEX", "TROPONINI" in the last 168 hours. No results for input(s): "PROBNP" in the last 8760 hours. Coagulation Profile: No results for input(s): "INR", "PROTIME" in the last 168 hours. Thyroid Function Tests: No results for input(s): "TSH", "T4TOTAL", "FREET4", "T3FREE", "THYROIDAB" in the last 72 hours. Lipid Profile: No results for input(s): "CHOL", "HDL", "LDLCALC", "TRIG", "CHOLHDL", "LDLDIRECT" in the last 72 hours. Anemia Panel: No results for input(s): "VITAMINB12", "FOLATE", "FERRITIN", "TIBC", "IRON", "RETICCTPCT" in the last 72 hours. Urine analysis:    Component Value Date/Time   COLORURINE YELLOW 09/05/2022 1520  APPEARANCEUR CLEAR 09/05/2022 1520   LABSPEC 1.015 09/05/2022 1520   PHURINE 8.5 (H) 09/05/2022 1520   GLUCOSEU NEGATIVE 09/05/2022 1520   HGBUR NEGATIVE 09/05/2022 [place] 09/05/2022 [place] 09/05/2022 1520   PROTEINUR NEGATIVE 09/05/2022 1520   UROBILINOGEN 0.2 01/13/2015 1752   NITRITE NEGATIVE 09/05/2022 1520   LEUKOCYTESUR NEGATIVE 09/05/2022 1520   Sepsis Labs: Invalid input(s): "PROCALCITONIN", "LACTICIDVEN"  Microbiology: Recent Results (from the past 240 hour(s))  MRSA Next Gen by PCR, Nasal     Status: Abnormal   Collection Time: 09/06/22  3:05 AM   Specimen: Nasal Mucosa; Nasal Swab  Result Value Ref Range Status   MRSA by PCR Next Gen DETECTED (A) NOT DETECTED Final    Comment: RESULT CALLED TO, READ BACK BY AND VERIFIED WITH: S MYERS,RN'@0529'$  09/06/22 [place] (NOTE) The GeneXpert MRSA Assay (FDA approved for NASAL specimens only), is one component of a comprehensive MRSA colonization surveillance program. It is not intended to diagnose MRSA infection nor to  guide or monitor treatment for MRSA infections. Test performance is not FDA approved in patients less than 2 years old. Performed at [facility], [place] [address] [place], [place] [address]     Radiology Studies: CT ABDOMEN PELVIS WO CONTRAST  Result Date: 09/06/2022 CLINICAL DATA:  Epigastric and right upper quadrant abdominal pain with nausea. EXAM: CT ABDOMEN AND PELVIS WITHOUT CONTRAST TECHNIQUE: Multidetector CT imaging of the abdomen and pelvis was performed following the standard protocol without IV contrast. RADIATION DOSE REDUCTION: This exam was performed according to the departmental dose-optimization program which includes automated exposure control, adjustment of the mA and/or kV according to patient size and/or use of iterative reconstruction technique. COMPARISON:  Right upper quadrant ultrasound performed yesterday. CT scan 02/05/2015. Abdominal MRI 10/07/2015 FINDINGS: Lower chest: Dependent atelectasis noted in both lung bases. Hepatobiliary: No suspicious focal abnormality in the liver on this study without intravenous contrast. Marked gallbladder wall thickening noted with layering dependent sludge/stones. No intrahepatic or extrahepatic biliary dilation. Pancreas: No focal mass lesion. No dilatation of the main duct. No intraparenchymal cyst. No peripancreatic edema. Spleen: No splenomegaly. No focal mass lesion. Adrenals/Urinary Tract: No adrenal nodule or mass. Kidneys unremarkable. No evidence for hydroureter. The urinary bladder appears normal for the degree of distention. Stomach/Bowel: Stomach is unremarkable. No gastric wall thickening. No evidence of outlet obstruction. Duodenum is normally positioned as is the ligament of Treitz. No small bowel wall thickening. No small bowel dilatation. The terminal ileum is normal. The appendix is normal. No gross colonic mass. No colonic wall thickening. Vascular/Lymphatic: No abdominal aortic aneurysm. No abdominal aortic  atherosclerotic calcification. There is no gastrohepatic or hepatoduodenal ligament lymphadenopathy. No retroperitoneal or mesenteric lymphadenopathy. No pelvic sidewall lymphadenopathy. Reproductive: IUD visualized in the uterus. There is no adnexal mass. Other: No intraperitoneal free fluid. Musculoskeletal: Fixation hardware noted proximal right femur, incompletely visualized. No worrisome lytic or sclerotic osseous abnormality. IMPRESSION: Marked gallbladder wall thickening with layering dependent sludge/stones. Imaging features suspicious for acute cholecystitis. No biliary dilatation. Electronically Signed   By: Misty Stanley M.D.   On: 09/06/2022 06:54   US Abdomen Limited RUQ (LIVER/GB)  Result Date: 09/05/2022 CLINICAL DATA:  Epigastric pain EXAM: ULTRASOUND ABDOMEN LIMITED RIGHT UPPER QUADRANT COMPARISON:  Ultrasound July 2016.  CT August 2016.  MRI [place] 2017 FINDINGS: Gallbladder: Shadowing stones in the gallbladder. Gallbladder is distended. There is however wall thickening and adjacent edema. Common bile duct: Diameter: 4 mm Liver: No focal  lesion identified. Within normal limits in parenchymal echogenicity. Known benign-appearing hepatic cysts measuring 17 mm. Portal vein is patent on color Doppler imaging with normal direction of blood flow towards the liver. Other: None. IMPRESSION: Gallbladder wall thickening and pericholecystic fluid with multiple stones. Please correlate for other clinical findings of acute cholecystitis. No ductal dilatation. Stable hepatic cyst Electronically Signed   By: Jill Side M.D.   On: 09/05/2022 15:51   DG Chest 2 View  Result Date: 09/05/2022 CLINICAL DATA:  Pain EXAM: CHEST - 2 VIEW COMPARISON:  None Available. FINDINGS: The heart size and mediastinal contours are within normal limits. Both lungs are clear. The visualized skeletal structures are unremarkable. IMPRESSION: No active cardiopulmonary disease. Electronically Signed   By: Dorise Bullion III M.D.    On: 09/05/2022 15:28      Magdalen [place] [place]  If 7PM-7AM, please contact night-coverage [email or web address] 09/06/2022, 10:41 AM

## 2022-09-07 ENCOUNTER — Encounter (HOSPITAL_COMMUNITY)
Admission: EM | Disposition: A | Discharge status: Home / Self Care | Payer: Self-pay | Source: Home / Self Care | Attending: Student

## 2022-09-07 ENCOUNTER — Other Ambulatory Visit: Payer: Self-pay

## 2022-09-07 ENCOUNTER — Inpatient Hospital Stay (HOSPITAL_COMMUNITY): Payer: BC Managed Care – PPO | Admitting: Anesthesiology

## 2022-09-07 ENCOUNTER — Encounter (HOSPITAL_COMMUNITY): Payer: Self-pay | Admitting: Internal Medicine

## 2022-09-07 DIAGNOSIS — K851 Biliary acute pancreatitis without necrosis or infection: Secondary | ICD-10-CM

## 2022-09-07 DIAGNOSIS — D649 Anemia, unspecified: Secondary | ICD-10-CM

## 2022-09-07 DIAGNOSIS — K810 Acute cholecystitis: Secondary | ICD-10-CM | POA: Diagnosis not present

## 2022-09-07 DIAGNOSIS — E876 Hypokalemia: Secondary | ICD-10-CM | POA: Diagnosis not present

## 2022-09-07 HISTORY — PX: CHOLECYSTECTOMY: SHX55

## 2022-09-07 LAB — CBC
HCT: 35.1 % — ABNORMAL LOW (ref 36.0–46.0)
Hemoglobin: 11.7 g/dL — ABNORMAL LOW (ref 12.0–15.0)
MCH: 31.8 pg (ref 26.0–34.0)
MCHC: 33.3 g/dL (ref 30.0–36.0)
MCV: 95.4 fL (ref 80.0–100.0)
Platelets: 261 10*3/uL (ref 150–400)
RBC: 3.68 MIL/uL — ABNORMAL LOW (ref 3.87–5.11)
RDW: 12.2 % (ref 11.5–15.5)
WBC: 5.4 10*3/uL (ref 4.0–10.5)
nRBC: 0 % (ref 0.0–0.2)

## 2022-09-07 LAB — RETICULOCYTES
Immature Retic Fract: 4.9 % (ref 2.3–15.9)
RBC.: 3.66 MIL/uL — ABNORMAL LOW (ref 3.87–5.11)
Retic Count, Absolute: 46.1 10*3/uL (ref 19.0–186.0)
Retic Ct Pct: 1.3 % (ref 0.4–3.1)

## 2022-09-07 LAB — COMPREHENSIVE METABOLIC PANEL
ALT: 256 U/L — ABNORMAL HIGH (ref 0–44)
AST: 108 U/L — ABNORMAL HIGH (ref 15–41)
Albumin: 3.1 g/dL — ABNORMAL LOW (ref 3.5–5.0)
Alkaline Phosphatase: 187 U/L — ABNORMAL HIGH (ref 38–126)
Anion gap: 9 (ref 5–15)
BUN: 9 mg/dL (ref 6–20)
CO2: 21 mmol/L — ABNORMAL LOW (ref 22–32)
Calcium: 8.4 mg/dL — ABNORMAL LOW (ref 8.9–10.3)
Chloride: 108 mmol/L (ref 98–111)
Creatinine, Ser: 0.87 mg/dL (ref 0.44–1.00)
GFR, Estimated: >60 mL/min (ref >60)
Glucose, Bld: 92 mg/dL (ref 70–99)
Potassium: 3.4 mmol/L — ABNORMAL LOW (ref 3.5–5.1)
Sodium: 138 mmol/L (ref 135–145)
Total Bilirubin: 0.5 mg/dL (ref 0.3–1.2)
Total Protein: 5.5 g/dL — ABNORMAL LOW (ref 6.5–8.1)

## 2022-09-07 LAB — LIPASE, BLOOD: Lipase: 32 U/L (ref 11–51)

## 2022-09-07 LAB — FOLATE: Folate: 12.7 ng/mL (ref >5.9)

## 2022-09-07 LAB — IRON AND TIBC
Iron: 56 ug/dL (ref 28–170)
Saturation Ratios: 18 % (ref 10.4–31.8)
TIBC: 305 ug/dL (ref 250–450)
UIBC: 249 ug/dL

## 2022-09-07 LAB — VITAMIN B12: Vitamin B-12: 367 pg/mL (ref 180–914)

## 2022-09-07 LAB — FERRITIN: Ferritin: 104 ng/mL (ref 11–307)

## 2022-09-07 SURGERY — LAPAROSCOPIC CHOLECYSTECTOMY WITH INTRAOPERATIVE CHOLANGIOGRAM
Anesthesia: General | Site: Abdomen

## 2022-09-07 MED ORDER — ORAL CARE MOUTH RINSE: 15.0000 mL | Freq: Once | OROMUCOSAL | Status: AC

## 2022-09-07 MED ORDER — OXYCODONE HCL 5 MG PO TABS
5.0000 mg | ORAL_TABLET | ORAL | Status: DC | PRN
  Administered 2022-09-07: 10 mg via ORAL
  Administered 2022-09-07: 5 mg via ORAL
  Administered 2022-09-07 – 2022-09-08 (×2): 10 mg via ORAL
  Filled 2022-09-07 (×2): qty 2
  Filled 2022-09-07: qty 1
  Filled 2022-09-07: qty 2

## 2022-09-07 MED ORDER — KETOROLAC TROMETHAMINE 30 MG/ML IJ SOLN
30.0000 mg | Freq: Once | INTRAMUSCULAR | Status: AC
  Administered 2022-09-07: 30 mg via INTRAVENOUS

## 2022-09-07 MED ORDER — PROMETHAZINE HCL 25 MG/ML IJ SOLN
INTRAMUSCULAR | Status: AC
  Filled 2022-09-07: qty 1

## 2022-09-07 MED ORDER — CHLORHEXIDINE GLUCONATE 0.12 % MT SOLN: 15.0000 mL | Freq: Once | OROMUCOSAL | Status: AC

## 2022-09-07 MED ORDER — POTASSIUM CHLORIDE IN NACL 20-0.9 MEQ/L-% IV SOLN: INTRAVENOUS | Status: DC

## 2022-09-07 MED ORDER — CHLORHEXIDINE GLUCONATE 0.12 % MT SOLN
OROMUCOSAL | Status: AC
  Administered 2022-09-07: 15 mL via OROMUCOSAL
  Filled 2022-09-07: qty 15

## 2022-09-07 MED ORDER — ONDANSETRON HCL 4 MG/2ML IJ SOLN
INTRAMUSCULAR | Status: AC
  Filled 2022-09-07: qty 2

## 2022-09-07 MED ORDER — LACTATED RINGERS IV SOLN: INTRAVENOUS | Status: DC

## 2022-09-07 MED ORDER — SODIUM CHLORIDE 0.9 % IR SOLN
Status: DC | PRN
  Administered 2022-09-07: 1000 mL

## 2022-09-07 MED ORDER — AMISULPRIDE (ANTIEMETIC) 5 MG/2ML IV SOLN
INTRAVENOUS | Status: AC
  Filled 2022-09-07: qty 4

## 2022-09-07 MED ORDER — FENTANYL CITRATE (PF) 250 MCG/5ML IJ SOLN
INTRAMUSCULAR | Status: AC
  Filled 2022-09-07: qty 5

## 2022-09-07 MED ORDER — POTASSIUM CHLORIDE IN NACL 20-0.9 MEQ/L-% IV SOLN
INTRAVENOUS | Status: DC
  Filled 2022-09-07 (×2): qty 1000

## 2022-09-07 MED ORDER — POTASSIUM CHLORIDE CRYS ER 20 MEQ PO TBCR: 40.0000 meq | EXTENDED_RELEASE_TABLET | ORAL | Status: DC

## 2022-09-07 MED ORDER — FENTANYL CITRATE (PF) 250 MCG/5ML IJ SOLN
INTRAMUSCULAR | Status: DC | PRN
  Administered 2022-09-07: 50 ug via INTRAVENOUS
  Administered 2022-09-07 (×2): 100 ug via INTRAVENOUS

## 2022-09-07 MED ORDER — DEXAMETHASONE SODIUM PHOSPHATE 10 MG/ML IJ SOLN
INTRAMUSCULAR | Status: DC | PRN
  Administered 2022-09-07: 10 mg via INTRAVENOUS

## 2022-09-07 MED ORDER — ROCURONIUM BROMIDE 10 MG/ML (PF) SYRINGE
PREFILLED_SYRINGE | INTRAVENOUS | Status: DC | PRN
  Administered 2022-09-07: 60 mg via INTRAVENOUS

## 2022-09-07 MED ORDER — PROPOFOL 10 MG/ML IV BOLUS
INTRAVENOUS | Status: AC
  Filled 2022-09-07: qty 20

## 2022-09-07 MED ORDER — PROMETHAZINE HCL 25 MG/ML IJ SOLN: 6.2500 mg | INTRAMUSCULAR | Status: DC | PRN

## 2022-09-07 MED ORDER — HYDROMORPHONE HCL 1 MG/ML IJ SOLN
1.0000 mg | INTRAMUSCULAR | Status: DC | PRN
  Administered 2022-09-07: 1 mg via INTRAVENOUS
  Filled 2022-09-07: qty 1

## 2022-09-07 MED ORDER — FENTANYL CITRATE (PF) 100 MCG/2ML IJ SOLN
INTRAMUSCULAR | Status: AC
  Filled 2022-09-07: qty 2

## 2022-09-07 MED ORDER — ACETAMINOPHEN 500 MG PO TABS
1000.0000 mg | ORAL_TABLET | Freq: Once | ORAL | Status: AC
  Administered 2022-09-07: 1000 mg via ORAL
  Filled 2022-09-07: qty 2

## 2022-09-07 MED ORDER — SUGAMMADEX SODIUM 200 MG/2ML IV SOLN
INTRAVENOUS | Status: DC | PRN
  Administered 2022-09-07: 200 mg via INTRAVENOUS

## 2022-09-07 MED ORDER — MIDAZOLAM HCL 5 MG/5ML IJ SOLN
INTRAMUSCULAR | Status: DC | PRN
  Administered 2022-09-07: 2 mg via INTRAVENOUS

## 2022-09-07 MED ORDER — FENTANYL CITRATE (PF) 100 MCG/2ML IJ SOLN
25.0000 ug | INTRAMUSCULAR | Status: DC | PRN
  Administered 2022-09-07 (×3): 50 ug via INTRAVENOUS

## 2022-09-07 MED ORDER — ROCURONIUM BROMIDE 10 MG/ML (PF) SYRINGE
PREFILLED_SYRINGE | INTRAVENOUS | Status: AC
  Filled 2022-09-07: qty 10

## 2022-09-07 MED ORDER — KETOROLAC TROMETHAMINE 30 MG/ML IJ SOLN
INTRAMUSCULAR | Status: AC
  Filled 2022-09-07: qty 1

## 2022-09-07 MED ORDER — AMISULPRIDE (ANTIEMETIC) 5 MG/2ML IV SOLN
10.0000 mg | Freq: Once | INTRAVENOUS | Status: AC | PRN
  Administered 2022-09-07: 10 mg via INTRAVENOUS

## 2022-09-07 MED ORDER — 0.9 % SODIUM CHLORIDE (POUR BTL) OPTIME
TOPICAL | Status: DC | PRN
  Administered 2022-09-07: 1000 mL

## 2022-09-07 MED ORDER — LIDOCAINE 2% (20 MG/ML) 5 ML SYRINGE
INTRAMUSCULAR | Status: DC | PRN
  Administered 2022-09-07: 100 mg via INTRAVENOUS

## 2022-09-07 MED ORDER — BUPIVACAINE-EPINEPHRINE 0.25% -1:200000 IJ SOLN
INTRAMUSCULAR | Status: DC | PRN
  Administered 2022-09-07: 20 mL

## 2022-09-07 MED ORDER — FENTANYL CITRATE (PF) 100 MCG/2ML IJ SOLN
25.0000 ug | INTRAMUSCULAR | Status: DC | PRN
  Administered 2022-09-07: 50 ug via INTRAVENOUS

## 2022-09-07 MED ORDER — SCOPOLAMINE 1 MG/3DAYS TD PT72
1.0000 | MEDICATED_PATCH | TRANSDERMAL | Status: DC
  Administered 2022-09-07: 1.5 mg via TRANSDERMAL
  Filled 2022-09-07 (×2): qty 1

## 2022-09-07 MED ORDER — LIDOCAINE 2% (20 MG/ML) 5 ML SYRINGE
INTRAMUSCULAR | Status: AC
  Filled 2022-09-07: qty 5

## 2022-09-07 MED ORDER — POTASSIUM CHLORIDE CRYS ER 20 MEQ PO TBCR
40.0000 meq | EXTENDED_RELEASE_TABLET | Freq: Once | ORAL | Status: AC
  Administered 2022-09-07: 40 meq via ORAL
  Filled 2022-09-07: qty 2

## 2022-09-07 MED ORDER — DEXAMETHASONE SODIUM PHOSPHATE 10 MG/ML IJ SOLN
INTRAMUSCULAR | Status: AC
  Filled 2022-09-07: qty 1

## 2022-09-07 MED ORDER — TOPIRAMATE 25 MG PO TABS
25.0000 mg | ORAL_TABLET | Freq: Every day | ORAL | Status: DC
  Administered 2022-09-07: 25 mg via ORAL
  Filled 2022-09-07: qty 1

## 2022-09-07 MED ORDER — MIDAZOLAM HCL 2 MG/2ML IJ SOLN
INTRAMUSCULAR | Status: AC
  Filled 2022-09-07: qty 2

## 2022-09-07 MED ORDER — PROPOFOL 10 MG/ML IV BOLUS
INTRAVENOUS | Status: DC | PRN
  Administered 2022-09-07: 160 mg via INTRAVENOUS

## 2022-09-07 MED ORDER — ONDANSETRON HCL 4 MG/2ML IJ SOLN
INTRAMUSCULAR | Status: DC | PRN
  Administered 2022-09-07: 4 mg via INTRAVENOUS

## 2022-09-07 MED ORDER — FENTANYL CITRATE (PF) 100 MCG/2ML IJ SOLN
INTRAMUSCULAR | Status: AC
  Administered 2022-09-07: 50 ug
  Filled 2022-09-07: qty 2

## 2022-09-07 SURGICAL SUPPLY — 33 items
APPLIER CLIP 5 13 M/L LIGAMAX5 (MISCELLANEOUS) ×1
BAG COUNTER SPONGE SURGICOUNT (BAG) ×1 IMPLANT
CANISTER SUCT 3000ML PPV (MISCELLANEOUS) ×1 IMPLANT
CHLORAPREP W/TINT 26 (MISCELLANEOUS) ×1 IMPLANT
CLIP APPLIE 5 13 M/L LIGAMAX5 (MISCELLANEOUS) ×1 IMPLANT
COVER SURGICAL LIGHT HANDLE (MISCELLANEOUS) ×1 IMPLANT
DERMABOND ADVANCED .7 DNX12 (GAUZE/BANDAGES/DRESSINGS) ×1 IMPLANT
ELECT REM PT RETURN 9FT ADLT (ELECTROSURGICAL) ×1
ELECTRODE REM PT RTRN 9FT ADLT (ELECTROSURGICAL) ×1 IMPLANT
GLOVE SURG SIGNA 7.5 PF LTX (GLOVE) ×1 IMPLANT
GOWN STRL REUS W/ TWL LRG LVL3 (GOWN DISPOSABLE) ×2 IMPLANT
GOWN STRL REUS W/ TWL XL LVL3 (GOWN DISPOSABLE) ×1 IMPLANT
GOWN STRL REUS W/TWL LRG LVL3 (GOWN DISPOSABLE) ×2
GOWN STRL REUS W/TWL XL LVL3 (GOWN DISPOSABLE) ×1
IRRIG SUCT STRYKERFLOW 2 WTIP (MISCELLANEOUS) ×1
IRRIGATION SUCT STRKRFLW 2 WTP (MISCELLANEOUS) ×1 IMPLANT
KIT BASIN OR (CUSTOM PROCEDURE TRAY) ×1 IMPLANT
KIT TURNOVER KIT B (KITS) ×1 IMPLANT
NS IRRIG 1000ML POUR BTL (IV SOLUTION) ×1 IMPLANT
PAD ARMBOARD 7.5X6 YLW CONV (MISCELLANEOUS) ×1 IMPLANT
SCISSORS LAP 5X35 DISP (ENDOMECHANICALS) ×1 IMPLANT
SET TUBE SMOKE EVAC HIGH FLOW (TUBING) ×1 IMPLANT
SLEEVE Z-THREAD 5X100MM (TROCAR) ×2 IMPLANT
SPECIMEN JAR SMALL (MISCELLANEOUS) ×1 IMPLANT
SUT MNCRL AB 4-0 PS2 18 (SUTURE) ×1 IMPLANT
SYS BAG RETRIEVAL 10MM (BASKET) ×1
SYSTEM BAG RETRIEVAL 10MM (BASKET) ×1 IMPLANT
TOWEL GREEN STERILE (TOWEL DISPOSABLE) ×1 IMPLANT
TOWEL GREEN STERILE FF (TOWEL DISPOSABLE) ×1 IMPLANT
TRAY LAPAROSCOPIC MC (CUSTOM PROCEDURE TRAY) ×1 IMPLANT
TROCAR BALLN 12MMX100 BLUNT (TROCAR) ×1 IMPLANT
TROCAR Z-THREAD OPTICAL 5X100M (TROCAR) ×1 IMPLANT
WATER STERILE IRR 1000ML POUR (IV SOLUTION) ×1 IMPLANT

## 2022-09-07 NOTE — Progress Notes (Signed)
Patient ID: Christine Montoya, female   DOB: Jan 31, 1976, 47 y.o.   MRN: [id number]   Pre Procedure note for inpatients:   Christine Montoya has been scheduled for Procedure(s): LAPAROSCOPIC CHOLECYSTECTOMY WITH POSSIBLE INTRAOPERATIVE CHOLANGIOGRAM (N/A) today. The various methods of treatment have been discussed with the patient.  I discussed the procedure in detail.    We discussed the risks and benefits of a laparoscopic cholecystectomy and possible cholangiogram including, but not limited to bleeding, infection, injury to surrounding structures such as the intestine or liver, bile leak, retained gallstones, need to convert to an open procedure, prolonged diarrhea, blood clots such as  DVT, common bile duct injury, anesthesia risks, and possible need for additional procedures.  The likelihood of improvement in symptoms and return to the patient's normal status is good. We discussed the typical post-operative recovery course.  After consideration of the risks, benefits and treatment options the patient has consented to the planned procedure.   The patient has been seen and labs reviewed. There are no changes in the patient's condition to prevent proceeding with the planned procedure today.  Recent labs:  Lab Results  Component Value Date   WBC 5.4 09/07/2022   HGB 11.7 (L) 09/07/2022   HCT 35.1 (L) 09/07/2022   PLT 261 09/07/2022   GLUCOSE 92 09/07/2022   ALT 256 (H) 09/07/2022   AST 108 (H) 09/07/2022   NA 138 09/07/2022   K 3.4 (L) 09/07/2022   CL 108 09/07/2022   CREATININE 0.87 09/07/2022   BUN 9 09/07/2022   CO2 21 (L) 09/07/2022    Coralie Keens, MD 09/07/2022 7:54 AM

## 2022-09-07 NOTE — Transfer of Care (Signed)
Immediate Anesthesia Transfer of Care Note  Patient: Christine Montoya  Procedure(s) Performed: LAPAROSCOPIC CHOLECYSTECTOMY (Abdomen)  Patient Location: PACU  Anesthesia Type:General  Level of Consciousness: awake, alert , and oriented  Airway & Oxygen Therapy: Patient Spontanous Breathing  Post-op Assessment: Report given to RN and Post -op Vital signs reviewed and stable  Post vital signs: Reviewed and stable  Last Vitals:  Vitals Value Taken Time  BP 146/77 09/07/22 1038  Temp    Pulse 74 09/07/22 1041  Resp 10 09/07/22 1041  SpO2 93 % 09/07/22 1041  Vitals shown include unvalidated device data.  Last Pain:  Vitals:   09/07/22 0843  TempSrc:   PainSc: 3       Patients Stated Pain Goal: 0 ([id number])  Complications: No notable events documented.

## 2022-09-07 NOTE — Progress Notes (Signed)
Daily Progress Note  DOA: 09/05/2022 Hospital Day: 3  Chief Complaint: gallstone pancreatitis  ASSESSMENT  Christine Montoya is a 47 y.o. female with no significant PMH admitted with gallstone pancreatitis / ? Acute cholecystitis.    # Cholelithiasis / gallstone pancreatitis / ? Acute cholecystitis.  LFTs were elevated without biliary duct dilation on [place] so possibly passed a stone. Lipase has normalized. She underwent lap cholecystectomy today.   PLAN  -GI will sign off.  -Recommended a low fat diet at home for the next few days -Reminded her about need for colon cancer screening once acute issues have resolved. Sounds like her PCP has already referred to [facility]  Subjective / New events last 24 hours: Feels okay. Mild post-op discomfort. No N/V  Imaging:  CT ABDOMEN PELVIS WO CONTRAST  Result Date: 09/06/2022 CLINICAL DATA:  Epigastric and right upper quadrant abdominal pain with nausea. EXAM: CT ABDOMEN AND PELVIS WITHOUT CONTRAST TECHNIQUE: Multidetector CT imaging of the abdomen and pelvis was performed following the standard protocol without IV contrast. RADIATION DOSE REDUCTION: This exam was performed according to the departmental dose-optimization program which includes automated exposure control, adjustment of the mA and/or kV according to patient size and/or use of iterative reconstruction technique. COMPARISON:  Right upper quadrant ultrasound performed yesterday. CT scan 02/05/2015. Abdominal MRI 10/07/2015 FINDINGS: Lower chest: Dependent atelectasis noted in both lung bases. Hepatobiliary: No suspicious focal abnormality in the liver on this study without intravenous contrast. Marked gallbladder wall thickening noted with layering dependent sludge/stones. No intrahepatic or extrahepatic biliary dilation. Pancreas: No focal mass lesion. No dilatation of the main duct. No intraparenchymal cyst. No peripancreatic edema. Spleen: No splenomegaly. No focal mass lesion.  Adrenals/Urinary Tract: No adrenal nodule or mass. Kidneys unremarkable. No evidence for hydroureter. The urinary bladder appears normal for the degree of distention. Stomach/Bowel: Stomach is unremarkable. No gastric wall thickening. No evidence of outlet obstruction. Duodenum is normally positioned as is the ligament of Treitz. No small bowel wall thickening. No small bowel dilatation. The terminal ileum is normal. The appendix is normal. No gross colonic mass. No colonic wall thickening. Vascular/Lymphatic: No abdominal aortic aneurysm. No abdominal aortic atherosclerotic calcification. There is no gastrohepatic or hepatoduodenal ligament lymphadenopathy. No retroperitoneal or mesenteric lymphadenopathy. No pelvic sidewall lymphadenopathy. Reproductive: IUD visualized in the uterus. There is no adnexal mass. Other: No intraperitoneal free fluid. Musculoskeletal: Fixation hardware noted proximal right femur, incompletely visualized. No worrisome lytic or sclerotic osseous abnormality. IMPRESSION: Marked gallbladder wall thickening with layering dependent sludge/stones. Imaging features suspicious for acute cholecystitis. No biliary dilatation. Electronically Signed   By: Misty Stanley M.D.   On: 09/06/2022 06:54   US Abdomen Limited RUQ (LIVER/GB)  Result Date: 09/05/2022 CLINICAL DATA:  Epigastric pain EXAM: ULTRASOUND ABDOMEN LIMITED RIGHT UPPER QUADRANT COMPARISON:  Ultrasound July 2016.  CT August 2016.  MRI [place] 2017 FINDINGS: Gallbladder: Shadowing stones in the gallbladder. Gallbladder is distended. There is however wall thickening and adjacent edema. Common bile duct: Diameter: 4 mm Liver: No focal lesion identified. Within normal limits in parenchymal echogenicity. Known benign-appearing hepatic cysts measuring 17 mm. Portal vein is patent on color Doppler imaging with normal direction of blood flow towards the liver. Other: None. IMPRESSION: Gallbladder wall thickening and pericholecystic fluid with  multiple stones. Please correlate for other clinical findings of acute cholecystitis. No ductal dilatation. Stable hepatic cyst Electronically Signed   By: Jill Side M.D.   On: 09/05/2022 15:51   DG Chest 2 View  Result Date: 09/05/2022 CLINICAL DATA:  Pain EXAM: CHEST - 2 VIEW COMPARISON:  None Available. FINDINGS: The heart size and mediastinal contours are within normal limits. Both lungs are clear. The visualized skeletal structures are unremarkable. IMPRESSION: No active cardiopulmonary disease. Electronically Signed   By: Dorise Bullion III M.D.   On: 09/05/2022 15:28     Lab Results: Recent Labs    09/05/22 1519 09/06/22 0429 09/07/22 0412  WBC 8.4 4.2 5.4  HGB 13.6 11.8* 11.7*  HCT 39.3 33.9* 35.1*  PLT 305 236 261   BMET Recent Labs    09/05/22 1519 09/06/22 0429 09/07/22 0412  NA 138 140 138  K 3.5 3.6 3.4*  CL 107 109 108  CO2 23 25 21*  GLUCOSE 106* 106* 92  BUN '14 9 9  '$ CREATININE 0.92 0.81 0.87  CALCIUM 8.9 8.4* 8.4*   LFT Recent Labs    09/07/22 0412  PROT 5.5*  ALBUMIN 3.1*  AST 108*  ALT 256*  ALKPHOS 187*  BILITOT 0.5   PT/INR No results for input(s): "LABPROT", "INR" in the last 72 hours.   Scheduled inpatient medications:   amisulpride       Chlorhexidine Gluconate Cloth  6 each Topical Q0600   citalopram  30 mg Oral QHS   fentaNYL       fentaNYL       ketorolac       mupirocin ointment  1 Application Nasal BID   pramipexole  0.75 mg Oral QHS   topiramate  25 mg Oral QHS   Continuous inpatient infusions:   0.9 % NaCl with KCl 20 mEq / L 75 mL/hr at 09/07/22 1202   cefTRIAXone (ROCEPHIN)  IV 2 g (09/06/22 2313)   metronidazole 500 mg (09/06/22 2156)   PRN inpatient medications: albuterol, amisulpride, clonazePAM, fentaNYL, fentaNYL, HYDROmorphone (DILAUDID) injection, ketorolac, naLOXone (NARCAN)  injection, ondansetron (ZOFRAN) IV, mouth rinse, oxyCODONE  Vital signs in last 24 hours: Temp:  [97.6 F (36.4 C)-98.6 F (37 C)]  97.6 F (36.4 C) (03/04 1506) Pulse Rate:  [47-78] 56 (03/04 1506) Resp:  [6-18] 17 (03/04 1506) BP: (112-146)/(74-86) 126/86 (03/04 1506) SpO2:  [96 %-100 %] 99 % (03/04 1506) Weight:  [74 kg] 74 kg (03/04 0833) Last BM Date : 09/05/22  Intake/Output Summary (Last 24 hours) at 09/07/2022 1523 Last data filed at 09/07/2022 1517 Gross per 24 hour  Intake 2722.44 ml  Output 25 ml  Net 2697.44 ml    Intake/Output from previous day: 03/03 0701 - 03/04 0700 In: 1539.3 [I.V.:1439.3; IV Piggyback:100] Out: -  Intake/Output this shift: Total I/O In: 1183.2 [P.O.:240; I.V.:743.2; IV Piggyback:200] Out: 25 [Blood:25]   Physical Exam:  General: Alert female in NAD Heart:  Regular rate and rhythm.  Pulmonary: Normal respiratory effort Abdomen: Soft, non-distended. A few bowel sounds. Extremities: No lower extremity edema  Neurologic: Alert and oriented Psych: Pleasant. Cooperative. Insight appears normal.    Principal Problem:   Acute cholecystitis Active Problems:   Acute gallstone pancreatitis   Mild intermittent asthma   Normocytic anemia     LOS: 1 day   Tye Savoy ,NP 09/07/2022, 3:23 PM

## 2022-09-07 NOTE — Anesthesia Postprocedure Evaluation (Addendum)
Anesthesia Post Note  Patient: Christine Montoya  Procedure(s) Performed: LAPAROSCOPIC CHOLECYSTECTOMY (Abdomen)     Patient location during evaluation: PACU Anesthesia Type: General Level of consciousness: sedated Pain management: pain level controlled Vital Signs Assessment: post-procedure vital signs reviewed and stable Respiratory status: spontaneous breathing and respiratory function stable Cardiovascular status: stable Postop Assessment: no apparent nausea or vomiting Anesthetic complications: no  No notable events documented.  Last Vitals:  Vitals:   09/07/22 1127 09/07/22 1141  BP: 118/81 116/81  Pulse: 62 69  Resp: 14 17  Temp: 36.8 C 36.8 C  SpO2: 96% 98%    Last Pain:  Vitals:   09/07/22 1141  TempSrc: Oral  PainSc:                  [name] DANIEL

## 2022-09-07 NOTE — Anesthesia Procedure Notes (Signed)
Procedure Name: Intubation Date/Time: 09/07/2022 9:58 AM  Performed by: Kyung Rudd, CRNAPre-anesthesia Checklist: Patient identified, Emergency Drugs available, Suction available and Patient being monitored Patient Re-evaluated:Patient Re-evaluated prior to induction Oxygen Delivery Method: Circle system utilized Preoxygenation: Pre-oxygenation with 100% oxygen Induction Type: IV induction Ventilation: Mask ventilation without difficulty Laryngoscope Size: Mac and 3 Grade View: Grade I Tube type: Oral Tube size: 7.0 mm Number of attempts: 1 Airway Equipment and Method: Stylet Placement Confirmation: ETT inserted through vocal cords under direct vision, positive ETCO2 and breath sounds checked- equal and bilateral Secured at: 21 cm Tube secured with: Tape Dental Injury: Teeth and Oropharynx as per pre-operative assessment

## 2022-09-07 NOTE — Progress Notes (Signed)
PROGRESS NOTE  Christine Montoya [id number] DOB: 18-Dec-1975   PCP: Jefm Petty, MD  Patient is from: Home  DOA: 09/05/2022 LOS: 1  Chief complaints Chief Complaint  Patient presents with   Abdominal Pain     Brief Narrative / Interim history: 47 year old F with PMH of migraine headache, chronic sinusitis, allergy, mild intermittent asthma and RLS presenting with epigastric and RUQ pain as well as nausea for 1 week, and admitted for acute cholecystitis and acute pancreatitis.  In ED, stable vitals.  No leukocytosis.  Mildly elevated transaminitis.  Lipase elevated to 4916.  Troponin negative x 2.  Urine pregnancy test negative.  UA and CXR without significant finding.  RUQ US showed gallbladder wall thickening and pericholecystic fluid with multiple stones.  No CBD dilation.  GI and general surgery consulted.  CT abdomen and pelvis ordered.  Patient was started on IV fluid and IV antibiotics and admitted.  CT abdomen and pelvis suggests acute cholecystitis but no radiologic evidence of pancreatitis.  Lipase down to normal.  LFT improving as well.  Underwent lap chole on 3/4.   Subjective: Seen and examined earlier this afternoon after she returned from surgery.  Sitting in bed and working on clear liquid diet.  Sore from surgery.  No other complaints.  Patient's father at bedside.  Objective: Vitals:   09/07/22 1115 09/07/22 1127 09/07/22 1141 09/07/22 1506  BP: 118/86 118/81 116/81 126/86  Pulse: (!) 58 62 69 (!) 56  Resp: '12 14 17 17  '$ Temp:  98.3 F (36.8 C) 98.3 F (36.8 C) 97.6 F (36.4 C)  TempSrc:   Oral Oral  SpO2: 98% 96% 98% 99%  Weight:      Height:        Examination:  GENERAL: No apparent distress.  Nontoxic. HEENT: MMM.  Vision and hearing grossly intact.  NECK: Supple.  No apparent JVD.  RESP:  No IWOB.  On room air. CVS:  RRR. Heart sounds normal.  MSK/EXT:   No apparent deformity. Moves extremities. No edema.  SKIN: no apparent skin lesion or  wound NEURO: Awake and alert. Oriented appropriately.  No apparent focal neuro deficit. PSYCH: Calm. Normal affect.   Procedures:  3/4-laparoscopic cholecystectomy  Microbiology summarized: MRSA PCR screen positive.  Assessment and plan: Principal Problem:   Acute cholecystitis Active Problems:   Acute gallstone pancreatitis   Mild intermittent asthma   Normocytic anemia   Acute calculus cholecystitis: RUQ [place] and CT abdomen and pelvis suggest this acute calculus cholecystitis.  Has no fever or leukocytosis but elevated LFT.  Lipase down to normal.  RUQ tenderness with positive Murphy's on exam.  -S/p lap chole on 3/4. -Continue IV antibiotics and IV fluid for today -Antiemetics and analgesics -On clear liquid diet.  Elevated lipase: Markedly elevated to 4968 on presentation and improved to 38.  No radiologic evidence of pancreatitis on CT.  Along with elevated LFT, she could have passed biliary stone. -GI signed off.  Normocytic anemia: Slight drop in Hgb likely dilutional.  Anemia panel basically normal. Recent Labs    09/05/22 1519 09/06/22 0429 09/07/22 0412  HGB 13.6 11.8* 11.7*  -Recheck in the morning   Mild intermittent asthma: Stable. -Continue bronchodilators  History of sinusitis/allergic: Does not seem to be on medication but med rec pending.  History of migraine headaches/depression: Stable.  -Continue home topiramate.  Body mass index is 27.15 kg/m.           DVT prophylaxis:  SCDs Start: 09/06/22 0243  Code Status: Full code Family Communication: Updated patient's father at bedside. Level of care: Med-Surg Status is: Inpatient Remains inpatient appropriate because: Due to acute calculus cholecystitis and acute pancreatitis   Final disposition: Likely home once medically cleared Consultants:  GI General surgery  35 minutes with more than 50% spent in reviewing records, counseling patient/family and coordinating care.   Sch Meds:   Scheduled Meds:  amisulpride       Chlorhexidine Gluconate Cloth  6 each Topical Q0600   citalopram  30 mg Oral QHS   fentaNYL       fentaNYL       ketorolac       mupirocin ointment  1 Application Nasal BID   pramipexole  0.75 mg Oral QHS   topiramate  25 mg Oral QHS   Continuous Infusions:  0.9 % NaCl with KCl 20 mEq / L 75 mL/hr at 09/07/22 1202   cefTRIAXone (ROCEPHIN)  IV 2 g (09/06/22 2313)   metronidazole 500 mg (09/06/22 2156)   PRN Meds:.albuterol, amisulpride, clonazePAM, fentaNYL, fentaNYL, HYDROmorphone (DILAUDID) injection, ketorolac, naLOXone (NARCAN)  injection, ondansetron (ZOFRAN) IV, mouth rinse, oxyCODONE  Antimicrobials: Anti-infectives (From admission, onward)    Start     Dose/Rate Route Frequency Ordered Stop   09/06/22 2200  cefTRIAXone (ROCEPHIN) 2 g in sodium chloride 0.9 % 100 mL IVPB        2 g 200 mL/hr over 30 Minutes Intravenous Every 24 hours 09/06/22 0245     09/06/22 1000  metroNIDAZOLE (FLAGYL) IVPB 500 mg        500 mg 100 mL/hr over 60 Minutes Intravenous Every 12 hours 09/06/22 0245     09/05/22 2300  metroNIDAZOLE (FLAGYL) IVPB 500 mg        500 mg 100 mL/hr over 60 Minutes Intravenous  Once 09/05/22 2250 09/06/22 0115   09/05/22 2215  cefTRIAXone (ROCEPHIN) 2 g in sodium chloride 0.9 % 100 mL IVPB        2 g 200 mL/hr over 30 Minutes Intravenous  Once 09/05/22 2200 09/05/22 2323        I have personally reviewed the following labs and images: CBC: Recent Labs  Lab 09/05/22 1519 09/06/22 0429 09/07/22 0412  WBC 8.4 4.2 5.4  HGB 13.6 11.8* 11.7*  HCT 39.3 33.9* 35.1*  MCV 91.6 93.1 95.4  PLT 305 236 261   BMP &GFR Recent Labs  Lab 09/05/22 1519 09/06/22 0429 09/07/22 0412  NA 138 140 138  K 3.5 3.6 3.4*  CL 107 109 108  CO2 23 25 21*  GLUCOSE 106* 106* 92  BUN '14 9 9  '$ CREATININE 0.92 0.81 0.87  CALCIUM 8.9 8.4* 8.4*   Estimated Creatinine Clearance: 80.5 mL/min (by C-G formula based on SCr of 0.87  mg/dL). Liver & Pancreas: Recent Labs  Lab 09/05/22 1519 09/06/22 0429 09/07/22 0412  AST 424* 310* 108*  ALT 374* 367* 256*  ALKPHOS 188* 178* 187*  BILITOT 1.3* 1.1 0.5  PROT 7.3 5.6* 5.5*  ALBUMIN 4.2 3.2* 3.1*   Recent Labs  Lab 09/05/22 1519 09/06/22 0429 09/07/22 0412  LIPASE 4,916* 126* 32   No results for input(s): "AMMONIA" in the last 168 hours. Diabetic: No results for input(s): "HGBA1C" in the last 72 hours. Recent Labs  Lab 09/06/22 2135  GLUCAP 98   Cardiac Enzymes: No results for input(s): "CKTOTAL", "CKMB", "CKMBINDEX", "TROPONINI" in the last 168 hours. No results for input(s): "PROBNP" in the last 8760 hours. Coagulation Profile: No  results for input(s): "INR", "PROTIME" in the last 168 hours. Thyroid Function Tests: No results for input(s): "TSH", "T4TOTAL", "FREET4", "T3FREE", "THYROIDAB" in the last 72 hours. Lipid Profile: No results for input(s): "CHOL", "HDL", "LDLCALC", "TRIG", "CHOLHDL", "LDLDIRECT" in the last 72 hours. Anemia Panel: Recent Labs    09/07/22 0412  VITAMINB12 367  FOLATE 12.7  FERRITIN 104  TIBC 305  IRON 56  RETICCTPCT 1.3   Urine analysis:    Component Value Date/Time   COLORURINE YELLOW 09/05/2022 1520   APPEARANCEUR CLEAR 09/05/2022 1520   LABSPEC 1.015 09/05/2022 1520   PHURINE 8.5 (H) 09/05/2022 1520   GLUCOSEU NEGATIVE 09/05/2022 1520   HGBUR NEGATIVE 09/05/2022 1520   BILIRUBINUR NEGATIVE 09/05/2022 1520   KETONESUR NEGATIVE 09/05/2022 1520   PROTEINUR NEGATIVE 09/05/2022 1520   UROBILINOGEN 0.2 01/13/2015 1752   NITRITE NEGATIVE 09/05/2022 1520   LEUKOCYTESUR NEGATIVE 09/05/2022 1520   Sepsis Labs: Invalid input(s): "PROCALCITONIN", "LACTICIDVEN"  Microbiology: Recent Results (from the past 240 hour(s))  MRSA Next Gen by PCR, Nasal     Status: Abnormal   Collection Time: 09/06/22  3:05 AM   Specimen: Nasal Mucosa; Nasal Swab  Result Value Ref Range Status   MRSA by PCR Next Gen DETECTED (A)  NOT DETECTED Final    Comment: RESULT CALLED TO, READ BACK BY AND VERIFIED WITH: S MYERS,RN'@0529'$  09/06/22 [place] (NOTE) The GeneXpert MRSA Assay (FDA approved for NASAL specimens only), is one component of a comprehensive MRSA colonization surveillance program. It is not intended to diagnose MRSA infection nor to guide or monitor treatment for MRSA infections. Test performance is not FDA approved in patients less than 58 years old. Performed at [facility], [place] [address], [place], [place] [address]     Radiology Studies: No results found.    Nakina Spatz T. [place]  If 7PM-7AM, please contact night-coverage [email or web address] 09/07/2022, 3:47 PM

## 2022-09-07 NOTE — Op Note (Signed)
Laparoscopic Cholecystectomy Procedure Note  Indications: This patient presents with symptomatic gallbladder disease and gallstone pancreatitis.  She has clinically improved  and will undergo laparoscopic cholecystectomy.  Pre-operative Diagnosis: gallstone pancreatitis  Post-operative Diagnosis: same  Surgeon: Coralie Keens   Assistants:   Anesthesia: General endotracheal anesthesia  ASA Class: 2  Procedure Details  The patient was seen again in the Holding Room. The risks, benefits, complications, treatment options, and expected outcomes were discussed with the patient. The possibilities of reaction to medication, pulmonary aspiration, perforation of viscus, bleeding, recurrent infection, finding a normal gallbladder, the need for additional procedures, failure to diagnose a condition, the possible need to convert to an open procedure, and creating a complication requiring transfusion or operation were discussed with the patient. The likelihood of improving the patient's symptoms with return to their baseline status is good.  The patient and/or family concurred with the proposed plan, giving informed consent. The site of surgery properly noted. The patient was taken to Operating Room, identified as Christine Montoya and the procedure verified as Laparoscopic Cholecystectomy with Intraoperative Cholangiogram. A Time Out was held and the above information confirmed.  Prior to the induction of general anesthesia, antibiotic prophylaxis was administered. General endotracheal anesthesia was then administered and tolerated well. After the induction, the abdomen was prepped with Chloraprep and draped in sterile fashion. The patient was positioned in the supine position.  Local anesthetic agent was injected into the skin near the umbilicus and an incision made. We dissected down to the abdominal fascia with blunt dissection.  The fascia was incised vertically and we entered the peritoneal cavity  bluntly.  A pursestring suture of 0-Vicryl was placed around the fascial opening.  The Hasson cannula was inserted and secured with the stay suture.  Pneumoperitoneum was then created with CO2 and tolerated well without any adverse changes in the patient's vital signs. A 5-mm port was placed in the subxiphoid position.  Two 5-mm ports were placed in the right upper quadrant. All skin incisions were infiltrated with a local anesthetic agent before making the incision and placing the trocars.   We positioned the patient in reverse Trendelenburg, tilted slightly to the patient's left.  The gallbladder was identified, the fundus grasped and retracted cephalad. Adhesions were lysed bluntly and with the electrocautery where indicated, taking care not to injure any adjacent organs or viscus. The infundibulum was grasped and retracted laterally, exposing the peritoneum overlying the triangle of Calot. This was then divided and exposed in a blunt fashion. The cystic duct was clearly identified and bluntly dissected circumferentially. A critical view of the cystic duct and cystic artery was obtained.  The cystic duct was then ligated with clips and divided. The cystic artery was, dissected free, ligated with clips and divided as well.   The gallbladder was dissected from the liver bed in retrograde fashion with the electrocautery. The gallbladder was removed and placed in an Endocatch sac. The liver bed was irrigated and inspected. Hemostasis was achieved with the electrocautery. Copious irrigation was utilized and was repeatedly aspirated until clear.  The gallbladder and Endocatch sac were then removed through the umbilical port site.  The pursestring suture was used to close the umbilical fascia.    We again inspected the right upper quadrant for hemostasis.  Pneumoperitoneum was released as we removed the trocars.  4-0 Monocryl was used to close the skin.   Skin glue was then applied. The patient was then extubated  and brought to the recovery room in  stable condition. Instrument, sponge, and needle counts were correct at closure and at the conclusion of the case.   Findings: Acute Cholecystitis with Cholelithiasis  Estimated Blood Loss: Minimal                 Specimens: Gallbladder                    Disposition: PACU - hemodynamically stable.         Condition: stable

## 2022-09-08 ENCOUNTER — Encounter (HOSPITAL_COMMUNITY): Payer: Self-pay | Admitting: Surgery

## 2022-09-08 DIAGNOSIS — K851 Biliary acute pancreatitis without necrosis or infection: Secondary | ICD-10-CM | POA: Diagnosis not present

## 2022-09-08 DIAGNOSIS — J4520 Mild intermittent asthma, uncomplicated: Secondary | ICD-10-CM

## 2022-09-08 DIAGNOSIS — K810 Acute cholecystitis: Secondary | ICD-10-CM | POA: Diagnosis not present

## 2022-09-08 DIAGNOSIS — D649 Anemia, unspecified: Secondary | ICD-10-CM | POA: Diagnosis not present

## 2022-09-08 LAB — CBC
HCT: 35.4 % — ABNORMAL LOW (ref 36.0–46.0)
Hemoglobin: 12.1 g/dL (ref 12.0–15.0)
MCH: 31.9 pg (ref 26.0–34.0)
MCHC: 34.2 g/dL (ref 30.0–36.0)
MCV: 93.4 fL (ref 80.0–100.0)
Platelets: 270 10*3/uL (ref 150–400)
RBC: 3.79 MIL/uL — ABNORMAL LOW (ref 3.87–5.11)
RDW: 12.1 % (ref 11.5–15.5)
WBC: 12.8 10*3/uL — ABNORMAL HIGH (ref 4.0–10.5)
nRBC: 0 % (ref 0.0–0.2)

## 2022-09-08 LAB — COMPREHENSIVE METABOLIC PANEL
ALT: 203 U/L — ABNORMAL HIGH (ref 0–44)
AST: 67 U/L — ABNORMAL HIGH (ref 15–41)
Albumin: 3.3 g/dL — ABNORMAL LOW (ref 3.5–5.0)
Alkaline Phosphatase: 173 U/L — ABNORMAL HIGH (ref 38–126)
Anion gap: 7 (ref 5–15)
BUN: 13 mg/dL (ref 6–20)
CO2: 21 mmol/L — ABNORMAL LOW (ref 22–32)
Calcium: 8.9 mg/dL (ref 8.9–10.3)
Chloride: 107 mmol/L (ref 98–111)
Creatinine, Ser: 0.92 mg/dL (ref 0.44–1.00)
GFR, Estimated: >60 mL/min (ref >60)
Glucose, Bld: 165 mg/dL — ABNORMAL HIGH (ref 70–99)
Potassium: 3.9 mmol/L (ref 3.5–5.1)
Sodium: 135 mmol/L (ref 135–145)
Total Bilirubin: 0.3 mg/dL (ref 0.3–1.2)
Total Protein: 5.8 g/dL — ABNORMAL LOW (ref 6.5–8.1)

## 2022-09-08 LAB — MAGNESIUM: Magnesium: 1.9 mg/dL (ref 1.7–2.4)

## 2022-09-08 LAB — PHOSPHORUS: Phosphorus: 3 mg/dL (ref 2.5–4.6)

## 2022-09-08 LAB — SURGICAL PATHOLOGY

## 2022-09-08 MED ORDER — OXYCODONE HCL 5 MG PO TABS: 5.0000 mg | ORAL_TABLET | Freq: Four times a day (QID) | ORAL | 0 refills | Status: AC | PRN

## 2022-09-08 MED ORDER — ACETAMINOPHEN 325 MG PO TABS: 650.0000 mg | ORAL_TABLET | Freq: Four times a day (QID) | ORAL | 2 refills | Status: AC | PRN

## 2022-09-08 NOTE — Progress Notes (Signed)
Progress Note  1 Day Post-Op  Subjective: Having some expected post op abdominal soreness. Ate soft breakfast this am without issues. Passing flatus.   Objective: Vital signs in last 24 hours: Temp:  [97.6 F (36.4 C)-98.6 F (37 C)] 98 F (36.7 C) (03/04 1950) Pulse Rate:  [56-78] 70 (03/04 1950) Resp:  [6-17] 17 (03/04 1506) BP: (116-146)/(77-86) 124/85 (03/04 1950) SpO2:  [94 %-99 %] 94 % (03/04 1950) Last BM Date : 09/05/22  Intake/Output from previous day: 03/04 0701 - 03/05 0700 In: 1183.2 [P.O.:240; I.V.:743.2; IV Piggyback:200] Out: 25 [Blood:25] Intake/Output this shift: No intake/output data recorded.  PE: General: pleasant, WD, female who is laying in bed in NAD Lungs:Respiratory effort nonlabored Abd: soft, ND, expected post op TTP greatest over incisions which are c/d/I with surgical glue MSK: all 4 extremities are symmetrical with no cyanosis, clubbing, or edema. Skin: warm and dry Psych: A&Ox3 with an appropriate affect.    Lab Results:  Recent Labs    09/07/22 0412 09/08/22 0358  WBC 5.4 12.8*  HGB 11.7* 12.1  HCT 35.1* 35.4*  PLT 261 270   BMET Recent Labs    09/07/22 0412 09/08/22 0358  NA 138 135  K 3.4* 3.9  CL 108 107  CO2 21* 21*  GLUCOSE 92 165*  BUN 9 13  CREATININE 0.87 0.92  CALCIUM 8.4* 8.9   PT/INR No results for input(s): "LABPROT", "INR" in the last 72 hours. CMP     Component Value Date/Time   NA 135 09/08/2022 0358   K 3.9 09/08/2022 0358   CL 107 09/08/2022 0358   CO2 21 (L) 09/08/2022 0358   GLUCOSE 165 (H) 09/08/2022 0358   BUN 13 09/08/2022 0358   CREATININE 0.92 09/08/2022 0358   CALCIUM 8.9 09/08/2022 0358   PROT 5.8 (L) 09/08/2022 0358   ALBUMIN 3.3 (L) 09/08/2022 0358   AST 67 (H) 09/08/2022 0358   ALT 203 (H) 09/08/2022 0358   ALKPHOS 173 (H) 09/08/2022 0358   BILITOT 0.3 09/08/2022 0358   GFRNONAA >60 09/08/2022 0358   GFRAA >60 01/13/2015 1805   Lipase     Component Value Date/Time    LIPASE 32 09/07/2022 0412       Studies/Results: No results found.  Anti-infectives: Anti-infectives (From admission, onward)    Start     Dose/Rate Route Frequency Ordered Stop   09/06/22 2200  cefTRIAXone (ROCEPHIN) 2 g in sodium chloride 0.9 % 100 mL IVPB        2 g 200 mL/hr over 30 Minutes Intravenous Every 24 hours 09/06/22 0245     09/06/22 1000  metroNIDAZOLE (FLAGYL) IVPB 500 mg        500 mg 100 mL/hr over 60 Minutes Intravenous Every 12 hours 09/06/22 0245     09/05/22 2300  metroNIDAZOLE (FLAGYL) IVPB 500 mg        500 mg 100 mL/hr over 60 Minutes Intravenous  Once 09/05/22 2250 09/06/22 0115   09/05/22 2215  cefTRIAXone (ROCEPHIN) 2 g in sodium chloride 0.9 % 100 mL IVPB        2 g 200 mL/hr over 30 Minutes Intravenous  Once 09/05/22 2200 09/05/22 2323        Assessment/Plan Gallstone pancreatitis S/p lap chole 3/4 By Dr. Ninfa Linden  T bili normal, LFTs improving WBC 12.8 post op - she does not need further abx from gallbladder perspective Tolerating diet  Stable for dc from surgical standpoint. Pain medication sent  FEN: soft ID:  rocephin/flagyl VTE: lovenox     LOS: 2 days   Winferd Humphrey, [facility] 09/08/2022, 10:01 AM Please see Amion for pager number during day hours 7:00am-4:30pm

## 2022-09-08 NOTE — Discharge Summary (Signed)
Physician Discharge Summary  Christine Montoya XJ:[id number] DOB: 10-02-1975 DOA: 09/05/2022  PCP: Jefm Petty, MD  Admit date: 09/05/2022 Discharge date: 09/08/2022 Admitted From: Home Disposition: Home Recommendations for Outpatient Follow-up:  Follow up with PCP and general surgery as below Check CMP and CBC at follow-up Please follow up on the following pending results: None  Home Health: Not indicated Equipment/Devices: Not indicated  Discharge Condition: Stable CODE STATUS: Full code  Follow-up Information     Jefm Petty, MD Follow up.   Specialty: Family Medicine Contact information: [id number] [place] [address] [phone]         Maczis, Emilie Rutter [place], [place]. Call.   Specialty: General Surgery Why: We are making a follow up appointment for you., Please call to confirm appointment time., Arrive 30 minutes early to complete check in, and bring photo ID and insurance card. Contact information: [place] [address] [phone]                 Hospital course 47 year old F with PMH of migraine headache, chronic sinusitis, allergy, mild intermittent asthma and RLS presenting with epigastric and RUQ pain as well as nausea for 1 week, and admitted for acute cholecystitis and acute pancreatitis.   In ED, stable vitals.  No leukocytosis.  Mildly elevated transaminitis.  Lipase elevated to 4916.  Troponin negative x 2.  Urine pregnancy test negative.  UA and CXR without significant finding.  RUQ US showed gallbladder wall thickening and pericholecystic fluid with multiple stones.  No CBD dilation.  GI and general surgery consulted.  CT abdomen and pelvis ordered.  Patient was started on IV fluid and IV antibiotics and admitted.   CT abdomen and pelvis suggests acute cholecystitis but no radiologic evidence of pancreatitis.  Lipase down to normal.  LFT improving as well.  Underwent lap chole on 3/4.  LFT improved  further.  Tolerated soft diet.  Cleared for discharge by general surgery for outpatient follow-up.  See individual problem list below for more.   Problems addressed during this hospitalization Principal Problem:   Acute cholecystitis Active Problems:   Acute gallstone pancreatitis   Mild intermittent asthma   Normocytic anemia   Acute calculus cholecystitis: RUQ [place] and CT abdomen and pelvis suggest this acute calculus cholecystitis.  Has no fever or leukocytosis but elevated LFT.  Lipase down to normal.  Tender with positive Percell Miller. -S/p lap chole on 3/4. -Outpatient follow-up with general surgery.   Elevated lipase: Markedly elevated to 4968 on presentation and improved to 38.  No radiologic evidence of pancreatitis on CT.  Along with elevated LFT, she could have passed biliary stone. -GI signed off.   Normocytic anemia: Slight drop in Hgb likely dilutional.  Anemia panel basically normal. -Repeat CBC in 1 week   Mild intermittent asthma: Stable. -Continue bronchodilators   History of sinusitis/allergic:  -Continue home meds   History of migraine headaches/depression: Stable.  -Continue home topiramate.           Vital signs Vitals:   09/07/22 1127 09/07/22 1141 09/07/22 1506 09/07/22 1950  BP: 118/81 116/81 126/86 124/85  Pulse: 62 69 (!) 56 70  Temp: 98.3 F (36.8 C) 98.3 F (36.8 C) 97.6 F (36.4 C) 98 F (36.7 C)  Resp: '14 17 17   '$ Height:      Weight:      SpO2: 96% 98% 99% 94%  TempSrc:  Oral Oral Oral  BMI (Calculated):  Discharge exam  GENERAL: No apparent distress.  Nontoxic. HEENT: MMM.  Vision and hearing grossly intact.  NECK: Supple.  No apparent JVD.  RESP:  No IWOB.  Fair aeration bilaterally. CVS:  RRR. Heart sounds normal.  ABD/GI/GU: BS+. Abd soft.  Mild tenderness after surgery.  No rebound.  Laparoscopic wounds clean MSK/EXT:  Moves extremities. No apparent deformity. No edema.  SKIN: no apparent skin lesion or wound NEURO:  Awake and alert. Oriented appropriately.  No apparent focal neuro deficit. PSYCH: Calm. Normal affect.   Discharge Instructions Discharge Instructions     Call MD for:  persistant nausea and vomiting   Complete by: As directed    Call MD for:  redness, tenderness, or signs of infection (pain, swelling, redness, odor or green/yellow discharge around incision site)   Complete by: As directed    Call MD for:  severe uncontrolled pain   Complete by: As directed    Diet general   Complete by: As directed    Discharge instructions   Complete by: As directed    It has been a pleasure taking care of you!  You were hospitalized due to gallstone with gallbladder infection and acute pancreatitis for which you have been treated surgically medically.  Your symptoms improved.  Follow-up with your primary care doctor in 1 to 2 weeks and your surgeon per their recommendation.   Take care,   Increase activity slowly   Complete by: As directed       Allergies as of 09/08/2022       Reactions   Buprenorphine Hcl Hives   Pt states she gets hives with IV morphine, severe with itching   Morphine And Related Hives   Pt states she gets hives with IV morphine, severe with itching   Nitrofurantoin Other (See Comments)   GI intolerance         Medication List     TAKE these medications    acetaminophen 325 MG tablet Commonly known as: Tylenol Take 2 tablets (650 mg total) by mouth every 6 (six) hours as needed for mild pain, headache or fever.   Armodafinil 200 MG Tabs Take 1 tablet by mouth daily as needed (To stay awake per patient).   citalopram 20 MG tablet Commonly known as: CELEXA Take 30 mg by mouth at bedtime.   clonazePAM 1 MG tablet Commonly known as: KLONOPIN Take 1 mg by mouth daily as needed for anxiety.   oxyCODONE 5 MG immediate release tablet Commonly known as: Oxy IR/ROXICODONE Take 1 tablet (5 mg total) by mouth every 6 (six) hours as needed for up to 5 days for  moderate pain or severe pain.   pramipexole 0.25 MG tablet Commonly known as: MIRAPEX Take 0.75 mg by mouth at bedtime.   topiramate 25 MG tablet Commonly known as: TOPAMAX Take 75 mg by mouth daily.   valACYclovir 500 MG tablet Commonly known as: VALTREX Take 500 mg by mouth 2 (two) times daily as needed (For outbreaks).        Consultations: GI General surgery  Procedures/Studies: 3/4-laparoscopic cholecystectomy   CT ABDOMEN PELVIS WO CONTRAST  Result Date: 09/06/2022 CLINICAL DATA:  Epigastric and right upper quadrant abdominal pain with nausea. EXAM: CT ABDOMEN AND PELVIS WITHOUT CONTRAST TECHNIQUE: Multidetector CT imaging of the abdomen and pelvis was performed following the standard protocol without IV contrast. RADIATION DOSE REDUCTION: This exam was performed according to the departmental dose-optimization program which includes automated exposure control, adjustment of the mA and/or  kV according to patient size and/or use of iterative reconstruction technique. COMPARISON:  Right upper quadrant ultrasound performed yesterday. CT scan 02/05/2015. Abdominal MRI 10/07/2015 FINDINGS: Lower chest: Dependent atelectasis noted in both lung bases. Hepatobiliary: No suspicious focal abnormality in the liver on this study without intravenous contrast. Marked gallbladder wall thickening noted with layering dependent sludge/stones. No intrahepatic or extrahepatic biliary dilation. Pancreas: No focal mass lesion. No dilatation of the main duct. No intraparenchymal cyst. No peripancreatic edema. Spleen: No splenomegaly. No focal mass lesion. Adrenals/Urinary Tract: No adrenal nodule or mass. Kidneys unremarkable. No evidence for hydroureter. The urinary bladder appears normal for the degree of distention. Stomach/Bowel: Stomach is unremarkable. No gastric wall thickening. No evidence of outlet obstruction. Duodenum is normally positioned as is the ligament of Treitz. No small bowel wall  thickening. No small bowel dilatation. The terminal ileum is normal. The appendix is normal. No gross colonic mass. No colonic wall thickening. Vascular/Lymphatic: No abdominal aortic aneurysm. No abdominal aortic atherosclerotic calcification. There is no gastrohepatic or hepatoduodenal ligament lymphadenopathy. No retroperitoneal or mesenteric lymphadenopathy. No pelvic sidewall lymphadenopathy. Reproductive: IUD visualized in the uterus. There is no adnexal mass. Other: No intraperitoneal free fluid. Musculoskeletal: Fixation hardware noted proximal right femur, incompletely visualized. No worrisome lytic or sclerotic osseous abnormality. IMPRESSION: Marked gallbladder wall thickening with layering dependent sludge/stones. Imaging features suspicious for acute cholecystitis. No biliary dilatation. Electronically Signed   By: Misty Stanley M.D.   On: 09/06/2022 06:54   US Abdomen Limited RUQ (LIVER/GB)  Result Date: 09/05/2022 CLINICAL DATA:  Epigastric pain EXAM: ULTRASOUND ABDOMEN LIMITED RIGHT UPPER QUADRANT COMPARISON:  Ultrasound July 2016.  CT August 2016.  MRI Terian[place] 2017 FINDINGS: Gallbladder: Shadowing stones in the gallbladder. Gallbladder is distended. There is however wall thickening and adjacent edema. Common bile duct: Diameter: 4 mm Liver: No focal lesion identified. Within normal limits in parenchymal echogenicity. Known benign-appearing hepatic cysts measuring 17 mm. Portal vein is patent on color Doppler imaging with normal direction of blood flow towards the liver. Other: None. IMPRESSION: Gallbladder wall thickening and pericholecystic fluid with multiple stones. Please correlate for other clinical findings of acute cholecystitis. No ductal dilatation. Stable hepatic cyst Electronically Signed   By: Jill Side M.D.   On: 09/05/2022 15:51   DG Chest 2 View  Result Date: 09/05/2022 CLINICAL DATA:  Pain EXAM: CHEST - 2 VIEW COMPARISON:  None Available. FINDINGS: The heart size and  mediastinal contours are within normal limits. Both lungs are clear. The visualized skeletal structures are unremarkable. IMPRESSION: No active cardiopulmonary disease. Electronically Signed   By: Dorise Bullion III M.D.   On: 09/05/2022 15:28       The results of significant diagnostics from this hospitalization (including imaging, microbiology, ancillary and laboratory) are listed below for reference.     Microbiology: Recent Results (from the past 240 hour(s))  MRSA Next Gen by PCR, Nasal     Status: Abnormal   Collection Time: 09/06/22  3:05 AM   Specimen: Nasal Mucosa; Nasal Swab  Result Value Ref Range Status   MRSA by PCR Next Gen DETECTED (A) NOT DETECTED Final    Comment: RESULT CALLED TO, READ BACK BY AND VERIFIED WITH: S MYERS,RN'@0529'$  09/06/22 [place] (NOTE) The GeneXpert MRSA Assay (FDA approved for NASAL specimens only), is one component of a comprehensive MRSA colonization surveillance program. It is not intended to diagnose MRSA infection nor to guide or monitor treatment for MRSA infections. Test performance is not FDA approved in patients less  than 98 years old. Performed at [facility], [place] [address] [place], [place] [address]      Labs:  CBC: Recent Labs  Lab 09/05/22 1519 09/06/22 0429 09/07/22 0412 09/08/22 0358  WBC 8.4 4.2 5.4 12.8*  HGB 13.6 11.8* 11.7* 12.1  HCT 39.3 33.9* 35.1* 35.4*  MCV 91.6 93.1 95.4 93.4  PLT 305 236 261 270   BMP &GFR Recent Labs  Lab 09/05/22 1519 09/06/22 0429 09/07/22 0412 09/08/22 0358  NA 138 140 138 135  K 3.5 3.6 3.4* 3.9  CL 107 109 108 107  CO2 23 25 21* 21*  GLUCOSE 106* 106* 92 165*  BUN '14 9 9 13  '$ CREATININE 0.92 0.81 0.87 0.92  CALCIUM 8.9 8.4* 8.4* 8.9  MG  --   --   --  1.9  PHOS  --   --   --  3.0   Estimated Creatinine Clearance: 76.1 mL/min (by C-G formula based on SCr of 0.92 mg/dL). Liver & Pancreas: Recent Labs  Lab 09/05/22 1519 09/06/22 0429 09/07/22 0412 09/08/22 0358  AST  424* 310* 108* 67*  ALT 374* 367* 256* 203*  ALKPHOS 188* 178* 187* 173*  BILITOT 1.3* 1.1 0.5 0.3  PROT 7.3 5.6* 5.5* 5.8*  ALBUMIN 4.2 3.2* 3.1* 3.3*   Recent Labs  Lab 09/05/22 1519 09/06/22 0429 09/07/22 0412  LIPASE 4,916* 126* 32   No results for input(s): "AMMONIA" in the last 168 hours. Diabetic: No results for input(s): "HGBA1C" in the last 72 hours. Recent Labs  Lab 09/06/22 2135  GLUCAP 98   Cardiac Enzymes: No results for input(s): "CKTOTAL", "CKMB", "CKMBINDEX", "TROPONINI" in the last 168 hours. No results for input(s): "PROBNP" in the last 8760 hours. Coagulation Profile: No results for input(s): "INR", "PROTIME" in the last 168 hours. Thyroid Function Tests: No results for input(s): "TSH", "T4TOTAL", "FREET4", "T3FREE", "THYROIDAB" in the last 72 hours. Lipid Profile: No results for input(s): "CHOL", "HDL", "LDLCALC", "TRIG", "CHOLHDL", "LDLDIRECT" in the last 72 hours. Anemia Panel: Recent Labs    09/07/22 0412  VITAMINB12 367  FOLATE 12.7  FERRITIN 104  TIBC 305  IRON 56  RETICCTPCT 1.3   Urine analysis:    Component Value Date/Time   COLORURINE YELLOW 09/05/2022 1520   APPEARANCEUR CLEAR 09/05/2022 1520   LABSPEC 1.015 09/05/2022 1520   PHURINE 8.5 (H) 09/05/2022 1520   GLUCOSEU NEGATIVE 09/05/2022 1520   HGBUR NEGATIVE 09/05/2022 1520   BILIRUBINUR NEGATIVE 09/05/2022 1520   KETONESUR NEGATIVE 09/05/2022 1520   PROTEINUR NEGATIVE 09/05/2022 1520   UROBILINOGEN 0.2 01/13/2015 1752   NITRITE NEGATIVE 09/05/2022 1520   LEUKOCYTESUR NEGATIVE 09/05/2022 1520   Sepsis Labs: Invalid input(s): "PROCALCITONIN", "LACTICIDVEN"   SIGNED:  Mercy Riding, MD  [facility] 09/08/2022, 11:19 AM

## 2022-09-08 NOTE — Progress Notes (Signed)
Discharge instructions given. Patient verbalized understanding and all questions were answered.  

## 2022-09-08 NOTE — Discharge Instructions (Signed)
[facility]  Please arrive at least 30 min before your appointment to complete your check in paperwork.  If you are unable to arrive 30 min prior to your appointment time we may have to cancel or reschedule you. LAPAROSCOPIC SURGERY: POST OP INSTRUCTIONS Always review your discharge instruction sheet given to you by the facility where your surgery was performed. IF YOU HAVE DISABILITY OR FAMILY LEAVE FORMS, YOU MUST BRING THEM TO THE OFFICE FOR PROCESSING.   DO NOT GIVE THEM TO YOUR DOCTOR.  PAIN CONTROL  First take acetaminophen (Tylenol) AND/or ibuprofen (Advil) to control your pain after surgery.  Follow directions on package.  Taking acetaminophen (Tylenol) and/or ibuprofen (Advil) regularly after surgery will help to control your pain and lower the amount of prescription pain medication you may need.  You should not take more than 4,000 mg (4 grams) of acetaminophen (Tylenol) in 24 hours.  You should not take ibuprofen (Advil), aleve, motrin, naprosyn or other NSAIDS if you have a history of stomach ulcers or chronic kidney disease.  A prescription for pain medication may be given to you upon discharge.  Take your pain medication as prescribed, if you still have uncontrolled pain after taking acetaminophen (Tylenol) or ibuprofen (Advil). Use ice packs to help control pain. If you need a refill on your pain medication, please contact your pharmacy.  They will contact our office to request authorization. Prescriptions will not be filled after 5pm or on week-ends.  HOME MEDICATIONS Take your usually prescribed medications unless otherwise directed.  DIET You should follow a light diet the first few days after arrival home.  Be sure to include lots of fluids daily. Avoid fatty, fried foods.   CONSTIPATION It is common to experience some constipation after surgery and if you are taking pain medication.  Increasing fluid intake and taking a stool softener (such as Colace)  will usually help or prevent this problem from occurring.  A mild laxative (Milk of Magnesia or Miralax) should be taken according to package instructions if there are no bowel movements after 48 hours.  WOUND/INCISION CARE Most patients will experience some swelling and bruising in the area of the incisions.  Ice packs will help.  Swelling and bruising can take several days to resolve.  Unless discharge instructions indicate otherwise, follow guidelines below  STERI-STRIPS - you may remove your outer bandages 48 hours after surgery, and you may shower at that time.  You have steri-strips (small skin tapes) in place directly over the incision.  These strips should be left on the skin for 7-10 days.   DERMABOND/SKIN GLUE - you may shower in 24 hours.  The glue will flake off over the next 2-3 weeks. Any sutures or staples will be removed at the office during your follow-up visit.  ACTIVITIES You may resume regular (light) daily activities beginning the next day--such as daily self-care, walking, climbing stairs--gradually increasing activities as tolerated.  You may have sexual intercourse when it is comfortable.  Refrain from any heavy lifting or straining until approved by your doctor. You may drive when you are no longer taking prescription pain medication, you can comfortably wear a seatbelt, and you can safely maneuver your car and apply brakes.  FOLLOW-UP You should see your doctor in the office for a follow-up appointment approximately 2-3 weeks after your surgery.  You should have been given your post-op/follow-up appointment when your surgery was scheduled.  If you did not receive a post-op/follow-up appointment, make sure   that you call for this appointment within a day or two after you arrive home to insure a convenient appointment time.   WHEN TO CALL YOUR DOCTOR: Fever over 101.0 Inability to urinate Continued bleeding from incision. Increased pain, redness, or drainage from the  incision. Increasing abdominal pain  The clinic staff is available to answer your questions during regular business hours.  Please don't hesitate to call and ask to speak to one of the nurses for clinical concerns.  If you have a medical emergency, go to the nearest emergency room or call 911.  A surgeon from [facility] is always on call at the hospital. [address], [address], [place], [place]  [address] ? [address] [address], [place], [place]   [address] [phone] ? [phone] ? FAX [phone]
